# Patient Record
Sex: Male | Born: 1945 | Race: White | Hispanic: No | Marital: Married | Smoking: Former smoker
Health system: Southern US, Community
[De-identification: ages and names within clinical notes are randomized; demographics above are authoritative.]

## PROBLEM LIST (undated history)

## (undated) DIAGNOSIS — I639 Cerebral infarction, unspecified: Secondary | ICD-10-CM

## (undated) DIAGNOSIS — R7611 Nonspecific reaction to tuberculin skin test without active tuberculosis: Secondary | ICD-10-CM

## (undated) DIAGNOSIS — H9190 Unspecified hearing loss, unspecified ear: Secondary | ICD-10-CM

## (undated) DIAGNOSIS — K5790 Diverticulosis of intestine, part unspecified, without perforation or abscess without bleeding: Secondary | ICD-10-CM

## (undated) DIAGNOSIS — N200 Calculus of kidney: Secondary | ICD-10-CM

## (undated) DIAGNOSIS — Z936 Other artificial openings of urinary tract status: Secondary | ICD-10-CM

## (undated) DIAGNOSIS — K219 Gastro-esophageal reflux disease without esophagitis: Secondary | ICD-10-CM

## (undated) DIAGNOSIS — Z86718 Personal history of other venous thrombosis and embolism: Secondary | ICD-10-CM

## (undated) DIAGNOSIS — C801 Malignant (primary) neoplasm, unspecified: Secondary | ICD-10-CM

## (undated) DIAGNOSIS — I10 Essential (primary) hypertension: Secondary | ICD-10-CM

## (undated) DIAGNOSIS — Z8551 Personal history of malignant neoplasm of bladder: Secondary | ICD-10-CM

## (undated) DIAGNOSIS — R000 Tachycardia, unspecified: Secondary | ICD-10-CM

## (undated) HISTORY — DX: Cerebral infarction, unspecified: I63.9

## (undated) HISTORY — DX: Personal history of other venous thrombosis and embolism: Z86.718

## (undated) HISTORY — DX: Essential (primary) hypertension: I10

## (undated) HISTORY — DX: Calculus of kidney: N20.0

---

## 2009-10-24 ENCOUNTER — Ambulatory Visit: Payer: Self-pay | Admitting: Gastroenterology

## 2012-10-29 DIAGNOSIS — I6789 Other cerebrovascular disease: Secondary | ICD-10-CM

## 2012-10-29 HISTORY — DX: Other cerebrovascular disease: I67.89

## 2014-03-21 ENCOUNTER — Ambulatory Visit: Payer: Self-pay

## 2014-04-07 ENCOUNTER — Ambulatory Visit: Payer: Self-pay | Admitting: Physician Assistant

## 2014-06-28 ENCOUNTER — Ambulatory Visit: Payer: Self-pay | Admitting: Emergency Medicine

## 2014-09-15 ENCOUNTER — Ambulatory Visit: Payer: Self-pay | Admitting: Gastroenterology

## 2015-02-21 LAB — SURGICAL PATHOLOGY

## 2015-06-15 ENCOUNTER — Other Ambulatory Visit: Payer: Self-pay | Admitting: Family Medicine

## 2015-06-15 DIAGNOSIS — R51 Headache: Principal | ICD-10-CM

## 2015-06-15 DIAGNOSIS — R519 Headache, unspecified: Secondary | ICD-10-CM

## 2015-06-22 ENCOUNTER — Ambulatory Visit
Admission: RE | Admit: 2015-06-22 | Discharge: 2015-06-22 | Disposition: A | Discharge status: Home / Self Care | Payer: Medicare Other | Source: Ambulatory Visit | Attending: Family Medicine | Admitting: Family Medicine

## 2015-06-22 DIAGNOSIS — R519 Headache, unspecified: Secondary | ICD-10-CM

## 2015-06-22 DIAGNOSIS — G319 Degenerative disease of nervous system, unspecified: Secondary | ICD-10-CM | POA: Insufficient documentation

## 2015-06-22 DIAGNOSIS — R51 Headache: Secondary | ICD-10-CM | POA: Insufficient documentation

## 2015-08-29 DIAGNOSIS — I679 Cerebrovascular disease, unspecified: Secondary | ICD-10-CM | POA: Insufficient documentation

## 2015-08-29 DIAGNOSIS — I10 Essential (primary) hypertension: Secondary | ICD-10-CM | POA: Insufficient documentation

## 2015-10-30 HISTORY — PX: BLADDER REMOVAL: SHX567

## 2015-12-13 ENCOUNTER — Other Ambulatory Visit: Payer: Self-pay | Admitting: Family Medicine

## 2015-12-13 DIAGNOSIS — M5416 Radiculopathy, lumbar region: Secondary | ICD-10-CM

## 2015-12-30 ENCOUNTER — Ambulatory Visit
Admission: RE | Admit: 2015-12-30 | Discharge: 2015-12-30 | Disposition: A | Discharge status: Home / Self Care | Payer: Medicare Other | Source: Ambulatory Visit | Attending: Family Medicine | Admitting: Family Medicine

## 2015-12-30 DIAGNOSIS — G8929 Other chronic pain: Secondary | ICD-10-CM | POA: Insufficient documentation

## 2015-12-30 DIAGNOSIS — M545 Low back pain: Secondary | ICD-10-CM | POA: Diagnosis present

## 2015-12-30 DIAGNOSIS — M47816 Spondylosis without myelopathy or radiculopathy, lumbar region: Secondary | ICD-10-CM | POA: Diagnosis not present

## 2015-12-30 DIAGNOSIS — M5136 Other intervertebral disc degeneration, lumbar region: Secondary | ICD-10-CM | POA: Insufficient documentation

## 2015-12-30 DIAGNOSIS — N289 Disorder of kidney and ureter, unspecified: Secondary | ICD-10-CM | POA: Diagnosis not present

## 2015-12-30 DIAGNOSIS — M5416 Radiculopathy, lumbar region: Secondary | ICD-10-CM

## 2016-01-20 DIAGNOSIS — M48061 Spinal stenosis, lumbar region without neurogenic claudication: Secondary | ICD-10-CM | POA: Insufficient documentation

## 2016-02-23 ENCOUNTER — Encounter: Payer: Self-pay | Admitting: Urology

## 2016-02-23 ENCOUNTER — Ambulatory Visit (INDEPENDENT_AMBULATORY_CARE_PROVIDER_SITE_OTHER): Payer: Medicare Other | Admitting: Urology

## 2016-02-23 VITALS — BP 129/79 | HR 58 | Ht 66.0 in | Wt 212.5 lb

## 2016-02-23 DIAGNOSIS — N281 Cyst of kidney, acquired: Secondary | ICD-10-CM

## 2016-02-23 DIAGNOSIS — Q6102 Congenital multiple renal cysts: Secondary | ICD-10-CM

## 2016-02-23 DIAGNOSIS — R3129 Other microscopic hematuria: Secondary | ICD-10-CM

## 2016-02-23 NOTE — Progress Notes (Signed)
02/23/2016 4:19 PM   Tora Perches Jan 02, 1946 [id number]  Referring provider: Sherrin Daisy, MD [place], [place] [id number]  No chief complaint on file.   HPI: The patient is a 70 year old gentleman who presents for evaluation of bilateral renal cyst and microscopic hematuria.   1. Microscopic Hematuria This is an incidental finding as nephrologist office. He did have a urinary tract infection MRSA 1 month prior to this sample that showed microhematuria. He is a former smoker.  2. Bilateral renal cysts Cysts are seen on a recent MRI of the spine. The largest cyst was 5 cm on the right. From what was visualized on the scan that did appear to be benign. He does complain of midline lower back pain that radiates to his shoulder. He denies CVA tenderness.  3. History of nephrolithiasis The patient has history of passing a stone spontaneously 50 years ago. This was his only stone.   Cr: 0.9 on January 26, 2016  PMH: Past Medical History  Diagnosis Date  . H/O blood clots   . Hypertension   . Stroke ([place])   . Kidney stone     Surgical History: No past surgical history on file.  Home Medications:    Medication List       This list is accurate as of: 02/23/16  4:19 PM.  Always use your most recent med list.               aspirin EC 81 MG tablet  Take by mouth.     Cetirizine HCl 10 MG Caps  Take by mouth.     Cinnamon 500 MG capsule  Take by mouth.     lisinopril 10 MG tablet  Commonly known as:  PRINIVIL,ZESTRIL  Take by mouth.     Omega-3 1000 MG Caps  Take by mouth.     Ubiquinol 100 MG Caps  Take by mouth.        Allergies:  Allergies  Allergen Reactions  . Cefaclor     Family History: Family History  Problem Relation Age of Onset  . Kidney disease Mother   . Prostate cancer Neg Hx   . Kidney cancer Neg Hx     Social History:  reports that he has quit smoking. He does not have any smokeless tobacco history on file. He  reports that he does not drink alcohol or use illicit drugs.  ROS: UROLOGY Frequent Urination?: Yes Hard to postpone urination?: Yes Burning/pain with urination?: No Get up at night to urinate?: Yes Leakage of urine?: Yes Urine stream starts and stops?: No Trouble starting stream?: No Do you have to strain to urinate?: No Blood in urine?: Yes Urinary tract infection?: No Sexually transmitted disease?: No Injury to kidneys or bladder?: No Painful intercourse?: No Weak stream?: Yes Erection problems?: No Penile pain?: No  Gastrointestinal Nausea?: No Vomiting?: No Indigestion/heartburn?: Yes Diarrhea?: No Constipation?: No  Constitutional Fever: No Night sweats?: No Weight loss?: No Fatigue?: No  Skin Skin rash/lesions?: Yes Itching?: No  Eyes Blurred vision?: No Double vision?: No  Ears/Nose/Throat Sore throat?: No Sinus problems?: Yes  Hematologic/Lymphatic Swollen glands?: No Easy bruising?: Yes  Cardiovascular Leg swelling?: No Chest pain?: No  Respiratory Cough?: No Shortness of breath?: No  Endocrine Excessive thirst?: No  Musculoskeletal Back pain?: Yes Joint pain?: No  Neurological Headaches?: Yes Dizziness?: No  Psychologic Depression?: No Anxiety?: No  Physical Exam: BP 129/79 mmHg  Pulse 58  Ht 5\' 6"  (1.676 m)  Wt 212 lb 8 oz (96.389 kg)  BMI 34.31 kg/m2  Constitutional:  Alert and oriented, No acute distress. HEENT: [place] AT, moist mucus membranes.  Trachea midline, no masses. Cardiovascular: No clubbing, cyanosis, or edema. Respiratory: Normal respiratory effort, no increased work of breathing. GI: Abdomen is soft, nontender, nondistended, no abdominal masses GU: No CVA tenderness.  Skin: No rashes, bruises or suspicious lesions. Lymph: No cervical or inguinal adenopathy. Neurologic: Grossly intact, no focal deficits, moving all 4 extremities. Psychiatric: Normal mood and affect.  Laboratory Data: No results found for:  WBC, HGB, HCT, MCV, PLT  No results found for: CREATININE  No results found for: PSA  No results found for: TESTOSTERONE  No results found for: HGBA1C  Urinalysis No results found for: COLORURINE, APPEARANCEUR, LABSPEC, PHURINE, GLUCOSEU, HGBUR, BILIRUBINUR, KETONESUR, PROTEINUR, UROBILINOGEN, NITRITE, LEUKOCYTESUR   Assessment & Plan:    1. Microscopic hematuria -CT urogram -follow up for cystoscopy after above  2. Bilateral renal cysts -will be able to better visualize on above imaging -Cysts unlikely source of the patient's back pain  3. History of nephrolithiasis -Imaging for above will delineate any stone burden if present  Return for cystoscopy after CT urogram.  Nickie Retort, [place] [address], [place],  [address] [phone]

## 2016-02-24 LAB — URINALYSIS, COMPLETE
BILIRUBIN UA: NEGATIVE
GLUCOSE, UA: NEGATIVE
Ketones, UA: NEGATIVE
Leukocytes, UA: NEGATIVE
Nitrite, UA: NEGATIVE
Specific Gravity, UA: 1.02 (ref 1.005–1.030)
UUROB: 0.2 mg/dL (ref 0.2–1.0)
pH, UA: 6.5 (ref 5.0–7.5)

## 2016-02-24 LAB — MICROSCOPIC EXAMINATION

## 2016-03-01 ENCOUNTER — Ambulatory Visit
Admission: RE | Admit: 2016-03-01 | Discharge: 2016-03-01 | Disposition: A | Discharge status: Home / Self Care | Payer: Medicare Other | Source: Ambulatory Visit | Attending: Urology | Admitting: Urology

## 2016-03-01 DIAGNOSIS — N281 Cyst of kidney, acquired: Secondary | ICD-10-CM

## 2016-03-01 DIAGNOSIS — I2510 Atherosclerotic heart disease of native coronary artery without angina pectoris: Secondary | ICD-10-CM | POA: Insufficient documentation

## 2016-03-01 DIAGNOSIS — R3129 Other microscopic hematuria: Secondary | ICD-10-CM | POA: Diagnosis not present

## 2016-03-01 DIAGNOSIS — N400 Enlarged prostate without lower urinary tract symptoms: Secondary | ICD-10-CM | POA: Diagnosis not present

## 2016-03-01 DIAGNOSIS — Q6102 Congenital multiple renal cysts: Secondary | ICD-10-CM | POA: Diagnosis present

## 2016-03-01 MED ORDER — IOPAMIDOL (ISOVUE-300) INJECTION 61%
150.0000 mL | Freq: Once | INTRAVENOUS | Status: AC | PRN
  Administered 2016-03-01: 125 mL via INTRAVENOUS

## 2016-03-15 ENCOUNTER — Ambulatory Visit: Payer: Medicare Other

## 2016-03-16 ENCOUNTER — Ambulatory Visit (INDEPENDENT_AMBULATORY_CARE_PROVIDER_SITE_OTHER): Payer: Medicare Other | Admitting: Urology

## 2016-03-16 ENCOUNTER — Encounter: Payer: Self-pay | Admitting: Urology

## 2016-03-16 VITALS — BP 144/84 | HR 49 | Ht 68.0 in | Wt 214.3 lb

## 2016-03-16 DIAGNOSIS — R3129 Other microscopic hematuria: Secondary | ICD-10-CM

## 2016-03-16 DIAGNOSIS — D494 Neoplasm of unspecified behavior of bladder: Secondary | ICD-10-CM

## 2016-03-16 LAB — URINALYSIS, COMPLETE
Bilirubin, UA: NEGATIVE
GLUCOSE, UA: NEGATIVE
KETONES UA: NEGATIVE
Nitrite, UA: NEGATIVE
SPEC GRAV UA: 1.025 (ref 1.005–1.030)
Urobilinogen, Ur: 0.2 mg/dL (ref 0.2–1.0)
pH, UA: 6.5 (ref 5.0–7.5)

## 2016-03-16 LAB — MICROSCOPIC EXAMINATION
RBC, UA: >30 /hpf — AB (ref 0–?)
WBC, UA: >30 /hpf — AB (ref 0–?)

## 2016-03-16 NOTE — Progress Notes (Signed)
03/16/2016 11:35 AM   Daniel Bush [birth year] [id number]  Referring provider: Sherrin Daisy, MD [place], [place] [id number]  Chief Complaint  Patient presents with  . Follow-up    microscopic hematruia, CT Results    HPI: The patient is a 70 year old gentleman who presents for evaluation of bilateral renal cyst and microscopic hematuria.   1. Microscopic Hematuria This is an incidental finding as nephrologist office. He did have a urinary tract infection MRSA 1 month prior to this sample that showed microhematuria. He is a former smoker.  CT scan showed plaque-like lesions on the left wall of the bladder concerning for TCC.  2. Bilateral renal cysts Cysts are seen on a recent MRI of the spine. The largest cyst was 5 cm on the right. From what was visualized on the scan that did appear to be benign. He does complain of midline lower back pain that radiates to his shoulder. He denies CVA tenderness.  3. History of nephrolithiasis The patient has history of passing a stone spontaneously 50 years ago. This was his only stone.   Cr: 0.9 on January 26, 2016    PMH: Past Medical History  Diagnosis Date  . H/O blood clots   . Hypertension   . Stroke ([place])   . Kidney stone     Surgical History: No past surgical history on file.  Home Medications:    Medication List       This list is accurate as of: 03/16/16 11:35 AM.  Always use your most recent med list.               ALEVE 220 MG tablet  Generic drug:  naproxen sodium  Take 220 mg by mouth.     aspirin EC 81 MG tablet  Take by mouth.     B COMPLEX-VITAMIN B12 PO  Take by mouth.     Cetirizine HCl 10 MG Caps  Take by mouth.     Cinnamon 500 MG capsule  Take by mouth.     lisinopril 10 MG tablet  Commonly known as:  PRINIVIL,ZESTRIL  Take by mouth.     niacin 100 MG tablet  Take 100 mg by mouth at bedtime.     Omega-3 1000 MG Caps  Take by mouth.     Ubiquinol 100 MG Caps    Take by mouth.        Allergies:  Allergies  Allergen Reactions  . Cefaclor     Family History: Family History  Problem Relation Age of Onset  . Kidney disease Mother   . Prostate cancer Neg Hx   . Kidney cancer Neg Hx     Social History:  reports that he has quit smoking. He does not have any smokeless tobacco history on file. He reports that he does not drink alcohol or use illicit drugs.  ROS: UROLOGY Frequent Urination?: No Hard to postpone urination?: Yes Burning/pain with urination?: No Get up at night to urinate?: Yes Leakage of urine?: Yes Urine stream starts and stops?: No Trouble starting stream?: No Do you have to strain to urinate?: No Blood in urine?: No Urinary tract infection?: No Sexually transmitted disease?: No Injury to kidneys or bladder?: No Painful intercourse?: No Weak stream?: No Erection problems?: No Penile pain?: No  Gastrointestinal Nausea?: No Vomiting?: No Indigestion/heartburn?: No Diarrhea?: No Constipation?: No  Constitutional Fever: No Night sweats?: No Weight loss?: No Fatigue?: No  Skin Skin rash/lesions?: No Itching?: No  Eyes  Blurred vision?: No Double vision?: No  Ears/Nose/Throat Sore throat?: No Sinus problems?: No  Hematologic/Lymphatic Swollen glands?: No Easy bruising?: No  Cardiovascular Leg swelling?: No Chest pain?: No  Respiratory Cough?: No Shortness of breath?: No  Endocrine Excessive thirst?: No  Musculoskeletal Back pain?: No Joint pain?: No  Neurological Headaches?: No Dizziness?: No  Psychologic Depression?: No Anxiety?: No  Physical Exam: BP 144/84 mmHg  Pulse 49  Ht 5\' 8"  (1.727 m)  Wt 214 lb 4.8 oz (97.206 kg)  BMI 32.59 kg/m2  Constitutional:  Alert and oriented, No acute distress. HEENT: [place] AT, moist mucus membranes.  Trachea midline, no masses. Cardiovascular: No clubbing, cyanosis, or edema. Respiratory: Normal respiratory effort, no increased work of  breathing. GI: Abdomen is soft, nontender, nondistended, no abdominal masses GU: No CVA tenderness.  Skin: No rashes, bruises or suspicious lesions. Lymph: No cervical or inguinal adenopathy. Neurologic: Grossly intact, no focal deficits, moving all 4 extremities. Psychiatric: Normal mood and affect.  Laboratory Data: No results found for: WBC, HGB, HCT, MCV, PLT  No results found for: CREATININE  No results found for: PSA  No results found for: TESTOSTERONE  No results found for: HGBA1C  Urinalysis    Component Value Date/Time   APPEARANCEUR Cloudy* 02/23/2016 1602   GLUCOSEU Negative 02/23/2016 1602   BILIRUBINUR Negative 02/23/2016 1602   PROTEINUR 1+* 02/23/2016 1602   NITRITE Negative 02/23/2016 1602   LEUKOCYTESUR Negative 02/23/2016 1602    Pertinent Imaging: CLINICAL DATA: Microscopic hematuria. Difficulty emptying bladder for several months. Followup of renal cyst.  EXAM: CT ABDOMEN AND PELVIS WITHOUT AND WITH CONTRAST  TECHNIQUE: Multidetector CT imaging of the abdomen and pelvis was performed following the standard protocol before and following the bolus administration of intravenous contrast.  CONTRAST: 110mL ISOVUE-300 IOPAMIDOL (ISOVUE-300) INJECTION 61%  COMPARISON: 12/30/2015 lumbar spine MRI.  FINDINGS: Lower chest: Clear lung bases. Right coronary artery atherosclerosis. Mild cardiomegaly. Tiny hiatal hernia.  Hepatobiliary: Scattered too small to characterize liver lesions. A left hepatic lobe 2.3 cm lesion is most consistent with a cyst or minimally complex cyst. Normal gallbladder, without biliary ductal dilatation.  Pancreas: Normal, without mass or ductal dilatation.  Spleen: Normal in size, without focal abnormality.  Adrenals/Urinary Tract: Normal adrenal glands. No renal calculi or hydronephrosis. No hydroureter or ureteric calculi. No bladder calculi. Right larger than left bilateral renal cysts. Other right renal  lesions which are too small to characterize. Small volume bilateral renal sinus cysts. Moderate renal collecting system opacification on delayed images. Good right and moderate left ureteric opacification, without filling defect.  Central soft tissue density within the posterior urinary bladder is favored to represent median lobe of the prostate on image 81/series 4. There is separate area of eccentric left bladder wall thickening and enhancement, including on image 77/series 4. Concurrent filling defects including on image 83/series 7.  Separate area of inferior bladder wall thickening and enhancement on sagittal image 80/ series 606.  Mild pericystic edema.  Stomach/Bowel: Normal remainder of the stomach. Scattered colonic diverticula. Normal terminal ileum and appendix. Normal small bowel.  Vascular/Lymphatic: Aortic and branch vessel atherosclerosis. No abdominopelvic adenopathy.  Reproductive: Prostatomegaly.  Other: No significant free fluid. Small bilateral fat containing inguinal hernias.  Musculoskeletal: Degenerative partial fusion of the bilateral sacroiliac joints.  IMPRESSION: 1. findings suspicious for plaque-like bladder carcinoma or carcinomas. Left-sided focal bladder wall thickening and hyperenhancement. Separate area of inferior bladder wall thickening and enhancement is suspicious for multifocal disease. No evidence of abdominal pelvic metastatic  disease. 2. Prostatomegaly with presumed impression of the median lobe into the urinary bladder. 3. Mild pericystic edema which given the clinical history could represent concurrent cystitis. 4. Atherosclerosis, including within the coronary arteries. These results will be called to the ordering clinician or representative by the Radiologist Assistant, and communication documented in the PACS or zVision Dashboard.   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 I discussed this with him in great detail. He understands the next that would be a transurethral resection of bladder tumor. We discussed the risks, benefits, indications procedure. The patient is interested in obtaining a second opinion from [facility] urology. We will arrange for the patient to undergo this consult. If he elects to have the surgery with [place], he will call us to schedule a TURBT.  1. Bladder lesion -Patient need's TURBT but would like a second opinion from [facility]. Will arrange for consult.   2. Microscopic hematuria -completion via cytoscopy during above  3. Bilateral renal cysts -benign  4. History of nephrolithiasis -No evidence of disease   Nickie Retort, [facility] [address], [place], [place] [address] [phone]

## 2016-03-19 ENCOUNTER — Telehealth: Payer: Self-pay | Admitting: Urology

## 2016-03-19 LAB — CULTURE, URINE COMPREHENSIVE

## 2016-03-19 NOTE — Telephone Encounter (Signed)
Faxed referral to [facility] for second opinion @ [phone] Phone# [phone]  They will review the notes and call the patient with the appointment   Sharyn Lull

## 2016-03-29 ENCOUNTER — Telehealth: Payer: Self-pay | Admitting: Urology

## 2016-03-29 ENCOUNTER — Other Ambulatory Visit: Payer: Medicare Other

## 2016-03-29 NOTE — Telephone Encounter (Signed)
Spoke with Daniel Bush at the [facility] [place] center today [phone] opt 1 She never called me back from 03-16-16 but I made his appt for 05-09-16 @ 9:00 with Dr. Thomos Lemons at the [facility]. I faxed his records to [phone]  I called and spoke with the patient and let him know and I have offered him a free parking pass to the [facility]. He will come by the office and get this. He will also call them daily/weekly to see if they can get him in earlier.

## 2016-04-23 DIAGNOSIS — N329 Bladder disorder, unspecified: Secondary | ICD-10-CM | POA: Insufficient documentation

## 2016-05-09 DIAGNOSIS — Z87438 Personal history of other diseases of male genital organs: Secondary | ICD-10-CM | POA: Insufficient documentation

## 2016-06-01 DIAGNOSIS — C678 Malignant neoplasm of overlapping sites of bladder: Secondary | ICD-10-CM | POA: Insufficient documentation

## 2016-07-26 HISTORY — PX: PROSTATECTOMY: SHX69

## 2016-10-29 DIAGNOSIS — B029 Zoster without complications: Secondary | ICD-10-CM

## 2016-10-29 HISTORY — DX: Zoster without complications: B02.9

## 2016-10-29 HISTORY — PX: BACK SURGERY: SHX140

## 2017-01-24 DIAGNOSIS — G8929 Other chronic pain: Secondary | ICD-10-CM | POA: Insufficient documentation

## 2017-01-24 DIAGNOSIS — M546 Pain in thoracic spine: Secondary | ICD-10-CM | POA: Insufficient documentation

## 2017-03-17 DIAGNOSIS — R000 Tachycardia, unspecified: Secondary | ICD-10-CM | POA: Insufficient documentation

## 2017-04-05 DIAGNOSIS — I872 Venous insufficiency (chronic) (peripheral): Secondary | ICD-10-CM | POA: Insufficient documentation

## 2017-08-25 IMAGING — CT CT ABD-PEL WO/W CM
2 of 10 series · 10 of 46 positions shown, 16 images · IV contrast (iopamidol)
Comparison: 12/30/2015 lumbar spine MRI.

CLINICAL DATA: Microscopic hematuria. Difficulty emptying bladder
for several months. Followup of renal cyst.

EXAM:
CT ABDOMEN AND PELVIS WITHOUT AND WITH CONTRAST
TECHNIQUE: Multidetector CT imaging of the abdomen and pelvis was performed
following the standard protocol before and following the bolus
administration of intravenous contrast.
CONTRAST:  125mL BY2UZ6-144 IOPAMIDOL (BY2UZ6-144) INJECTION 61%

[Series 7: hematuria > 45 10 min delay · axial · delayed · 0.83mm/px · z∈[-942,-542]mm · 8 of 104 slices shown, 13 images]
[im 12/104  soft-tissue]
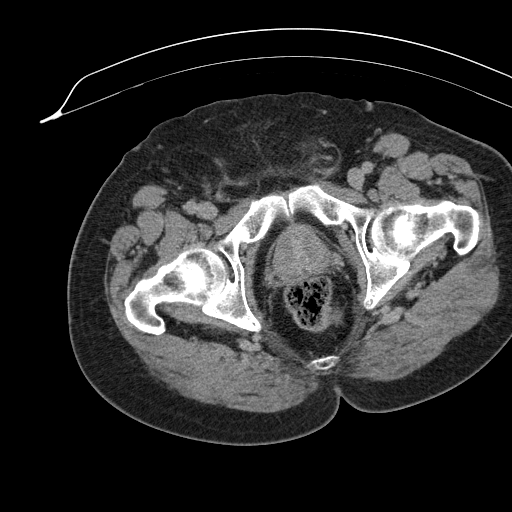
[im 12/104  bone]
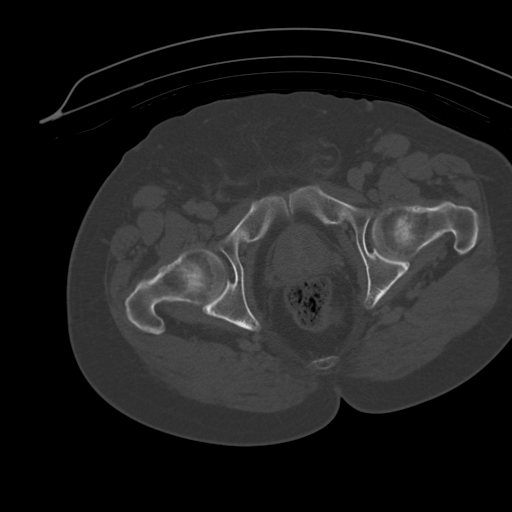
[im 23/104  soft-tissue]
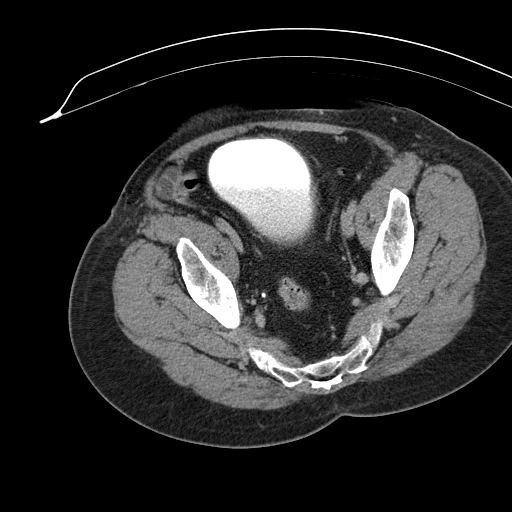
[im 35/104  soft-tissue]
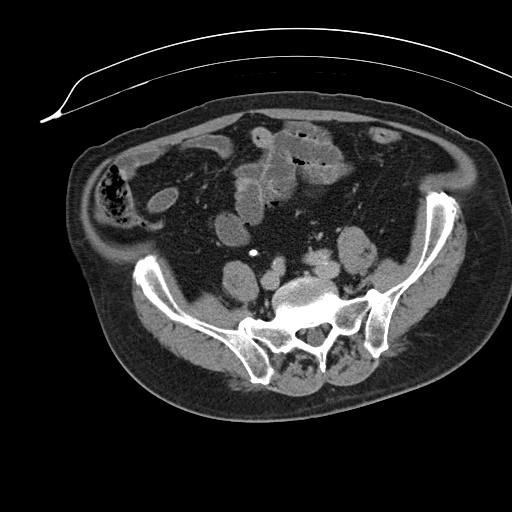
[im 46/104  soft-tissue]
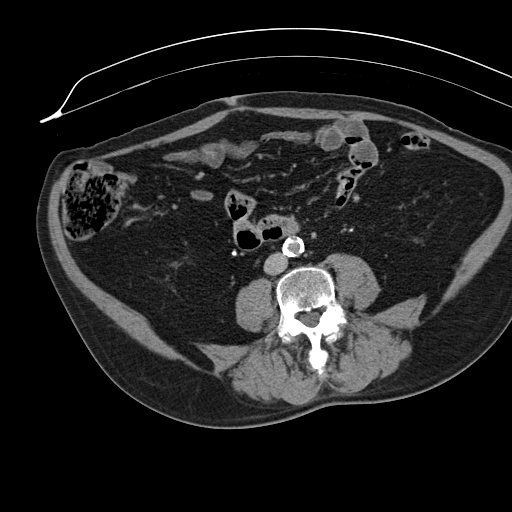
[im 58/104  soft-tissue]
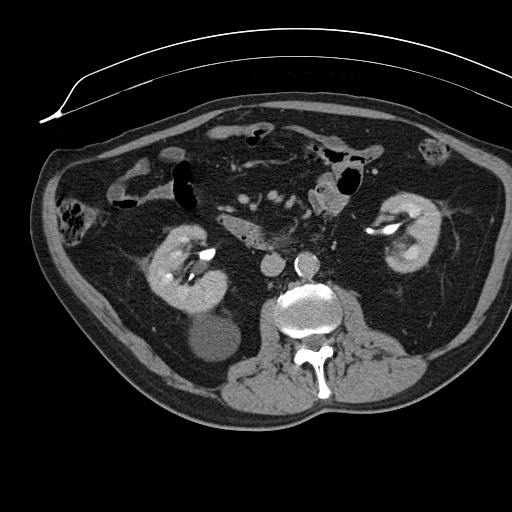
[im 58/104  lung]
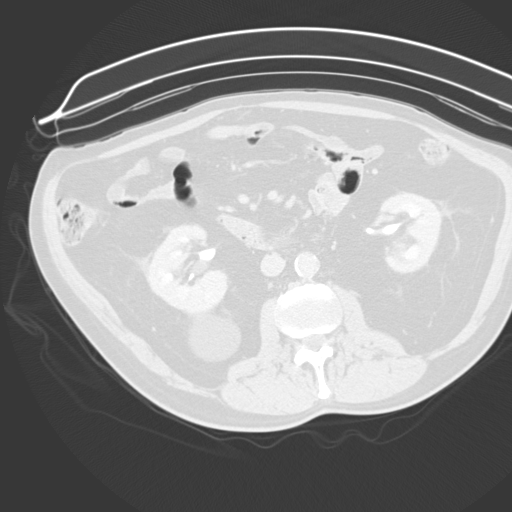
[im 69/104  soft-tissue]
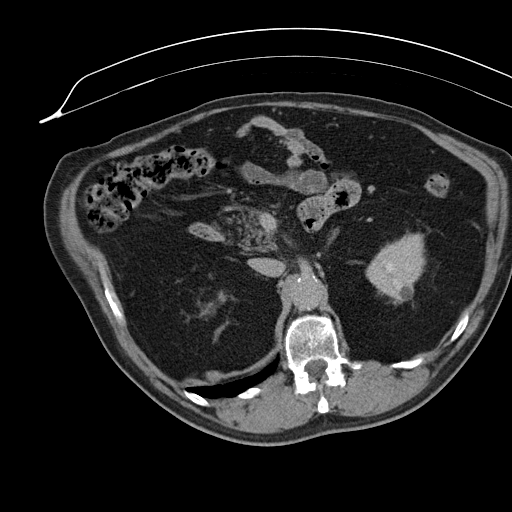
[im 69/104  lung]
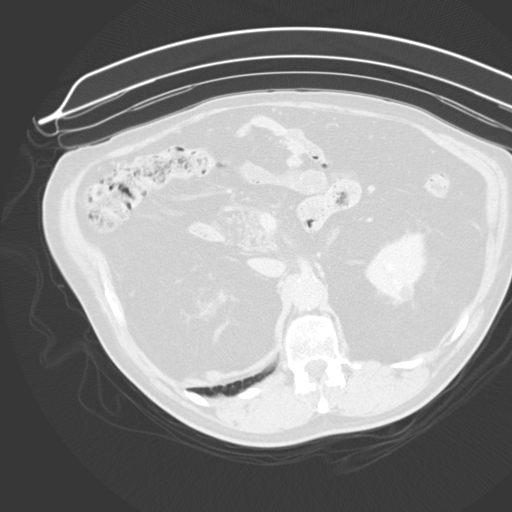
[im 81/104  soft-tissue]
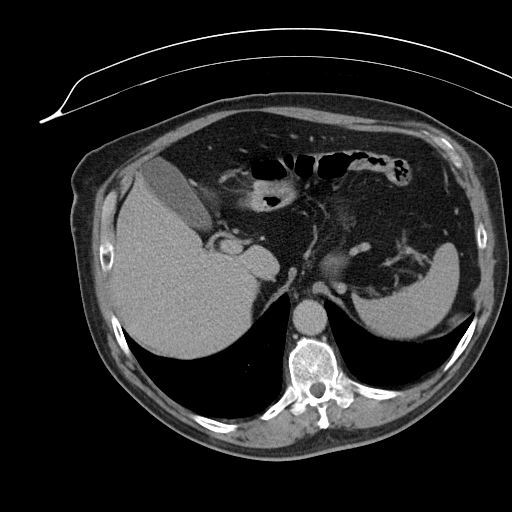
[im 81/104  lung]
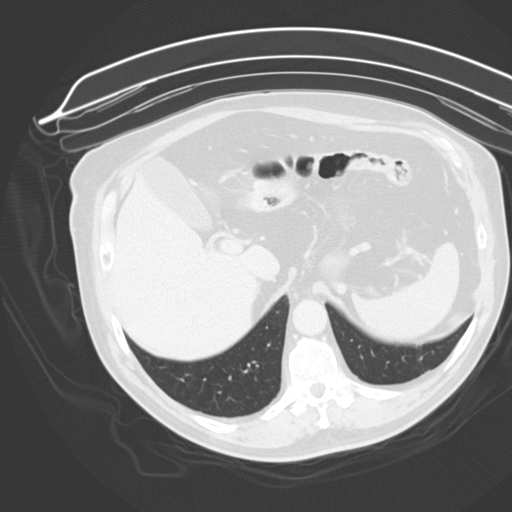
[im 92/104  soft-tissue]
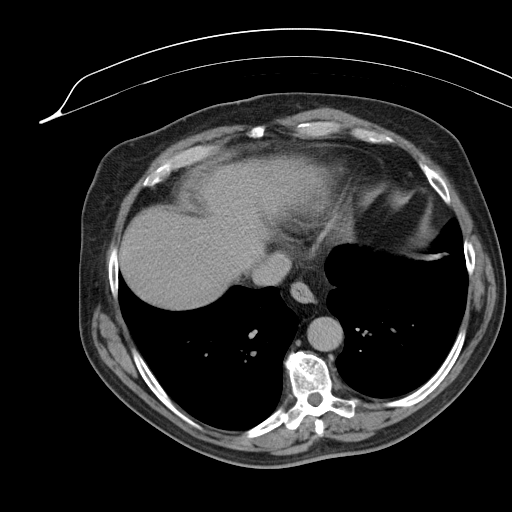
[im 92/104  lung]
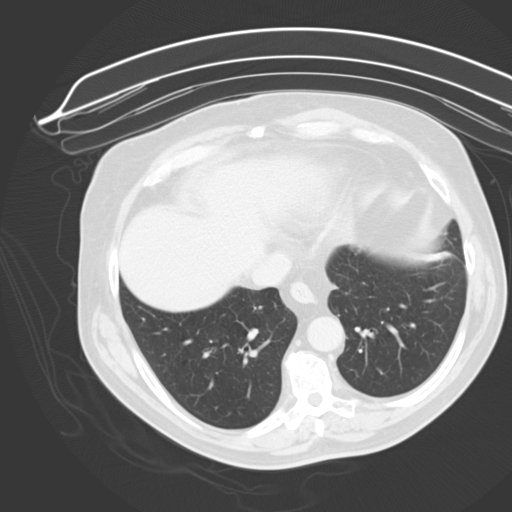

[Series 603: coronal wo · coronal · 0.97mm/px · 2 of 142 slices shown, 3 images]
[im 48/142  soft-tissue]
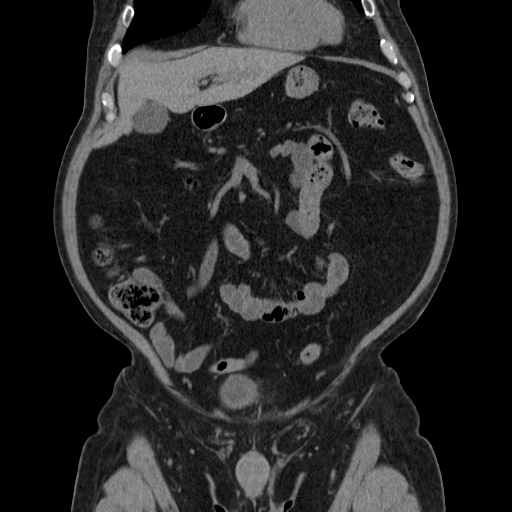
[im 48/142  bone]
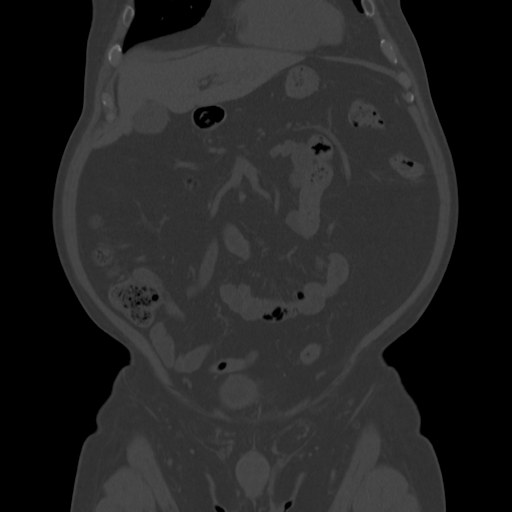
[im 95/142  soft-tissue]
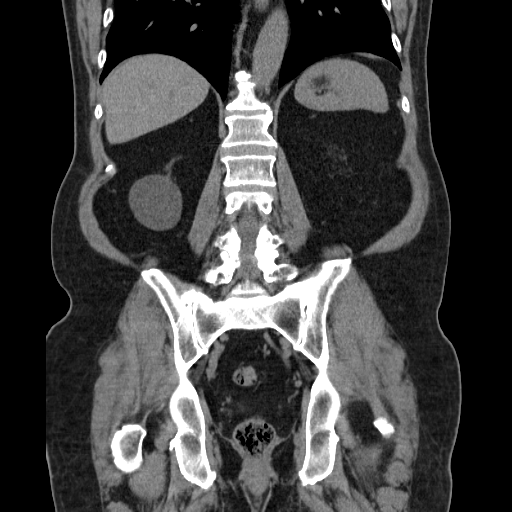

[10 of 46 positions shown; findings below may reference images not displayed]

FINDINGS: Lower chest: Clear lung bases. Right coronary artery
atherosclerosis. Mild cardiomegaly. Tiny hiatal hernia.

Hepatobiliary: Scattered too small to characterize liver lesions. A
left hepatic lobe 2.3 cm lesion is most consistent with a cyst or
minimally complex cyst. Normal gallbladder, without biliary ductal
dilatation.

Pancreas: Normal, without mass or ductal dilatation.

Spleen: Normal in size, without focal abnormality.

Adrenals/Urinary Tract: Normal adrenal glands. No renal calculi or
hydronephrosis. No hydroureter or ureteric calculi. No bladder
calculi. Right larger than left bilateral renal cysts. Other right
renal lesions which are too small to characterize. Small volume
bilateral renal sinus cysts. Moderate renal collecting system
opacification on delayed images. Good right and moderate left
ureteric opacification, without filling defect.

Central soft tissue density within the posterior urinary bladder is
favored to represent median lobe of the prostate on image 81/series
4. There is separate area of eccentric left bladder wall thickening
and enhancement, including on image 77/series 4. Concurrent filling
defects including on image 83/series 7.

Separate area of inferior bladder wall thickening and enhancement on
sagittal image 80/ series 606.

Mild pericystic edema.

Stomach/Bowel: Normal remainder of the stomach. Scattered colonic
diverticula. Normal terminal ileum and appendix. Normal small bowel.

Vascular/Lymphatic: Aortic and branch vessel atherosclerosis. No
abdominopelvic adenopathy.

Reproductive: Prostatomegaly.

Other: No significant free fluid. Small bilateral fat containing
inguinal hernias.

Musculoskeletal: Degenerative partial fusion of the bilateral
sacroiliac joints.
IMPRESSION: 1. findings suspicious for plaque-like bladder carcinoma or
carcinomas. Left-sided focal bladder wall thickening and
hyperenhancement. Separate area of inferior bladder wall thickening
and enhancement is suspicious for multifocal disease. No evidence of
abdominal pelvic metastatic disease.
2. Prostatomegaly with presumed impression of the median lobe into
the urinary bladder.
3. Mild pericystic edema which given the clinical history could
represent concurrent cystitis.
4.  Atherosclerosis, including within the coronary arteries.
These results will be called to the ordering clinician or
representative by the Radiologist Assistant, and communication
documented in the PACS or zVision Dashboard.

## 2018-09-10 ENCOUNTER — Other Ambulatory Visit: Payer: Self-pay | Admitting: Nurse Practitioner

## 2018-09-10 DIAGNOSIS — M545 Low back pain, unspecified: Secondary | ICD-10-CM

## 2018-09-10 DIAGNOSIS — M48061 Spinal stenosis, lumbar region without neurogenic claudication: Secondary | ICD-10-CM

## 2018-09-19 DIAGNOSIS — M4316 Spondylolisthesis, lumbar region: Secondary | ICD-10-CM | POA: Insufficient documentation

## 2018-09-30 ENCOUNTER — Ambulatory Visit: Payer: Medicare Other

## 2018-10-07 DIAGNOSIS — E538 Deficiency of other specified B group vitamins: Secondary | ICD-10-CM | POA: Insufficient documentation

## 2018-11-27 DIAGNOSIS — Z9889 Other specified postprocedural states: Secondary | ICD-10-CM | POA: Insufficient documentation

## 2019-06-13 ENCOUNTER — Ambulatory Visit
Admission: EM | Admit: 2019-06-13 | Discharge: 2019-06-13 | Disposition: A | Discharge status: Home / Self Care | Payer: Medicare Other | Attending: Family Medicine | Admitting: Family Medicine

## 2019-06-13 DIAGNOSIS — N3001 Acute cystitis with hematuria: Secondary | ICD-10-CM

## 2019-06-13 LAB — URINALYSIS, COMPLETE (UACMP) WITH MICROSCOPIC
Glucose, UA: NEGATIVE mg/dL
Nitrite: NEGATIVE
Protein, ur: 100 mg/dL — AB
Specific Gravity, Urine: 1.015 (ref 1.005–1.030)
pH: 6 (ref 5.0–8.0)

## 2019-06-13 MED ORDER — CIPROFLOXACIN HCL 500 MG PO TABS: 500.0000 mg | ORAL_TABLET | Freq: Two times a day (BID) | ORAL | 0 refills | Status: DC

## 2019-06-13 NOTE — Discharge Instructions (Signed)
Continue increased water intake

## 2019-06-13 NOTE — ED Provider Notes (Signed)
[facility]    CSN: [id number] Arrival date & time: 06/13/19  1006     History   Chief Complaint Chief Complaint  Patient presents with  . Urinary Tract Infection    HPI Daniel Bush is a 73 y.o. male.   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 Denies any abdominal pain, vomiting or chills.    Urinary Tract Infection   Past Medical History:  Diagnosis Date  . H/O blood clots   . Hypertension   . Kidney stone   . Stroke [facility])     Patient Active Problem List   Diagnosis Date Noted  . Lumbar canal stenosis 01/20/2016  . Lumbar radiculopathy 12/13/2015  . 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 Essential (primary) hypertension 08/29/2015    Past Surgical History:  Procedure Laterality Date  . BACK SURGERY    . BLADDER REMOVAL         Home Medications    Prior to Admission medications   Medication Sig Start Date End Date Taking? Authorizing Provider  carvedilol (COREG) 3.125 MG tablet  06/01/19  Yes [provider]  aspirin EC 81 MG tablet Take by mouth.    [provider]  B Complex-Folic Acid (B COMPLEX-VITAMIN B12 PO) Take by mouth.    [provider]  Cetirizine HCl 10 MG CAPS Take by mouth.    [provider]  Cinnamon 500 MG capsule Take by mouth.    [provider]  ciprofloxacin (CIPRO) 500 MG tablet Take 1 tablet (500 mg total) by mouth every 12 (twelve) hours. 06/13/19   Norval Gable, MD  lisinopril (PRINIVIL,ZESTRIL) 10 MG tablet Take by mouth. 08/11/15 08/10/16  [provider]  naproxen sodium (ALEVE) 220 MG tablet Take 220 mg by mouth.    [provider]  niacin 100 MG tablet Take 100 mg by mouth at bedtime.    [provider]  Omega-3 1000 MG CAPS Take by mouth.    [provider]   Ubiquinol 100 MG CAPS Take by mouth.    [provider]    Family History Family History  Problem Relation Age of Onset  . Kidney disease Mother   . Prostate cancer Neg Hx   . Kidney cancer Neg Hx     Social History Social History   Tobacco Use  . Smoking status: Former Research scientist (life sciences)  . Smokeless tobacco: Never Used  Substance Use Topics  . Alcohol use: No    Alcohol/week: 0.0 standard drinks  . Drug use: No     Allergies   Bee venom, Chlorhexidine, Diltiazem, Lisinopril, Cefaclor, Losartan, and Oxycodone-acetaminophen   Review of Systems Review of Systems   Physical Exam Triage Vital Signs ED Triage Vitals  Enc Vitals Group     BP 06/13/19 1019 (!) 155/101     Pulse Rate 06/13/19 1019 (!) 57     Resp 06/13/19 1019 18     Temp 06/13/19 1019 98.2 F (36.8 C)     Temp src --      SpO2 06/13/19 1019 100 %     Weight 06/13/19 1019 200 lb (90.7 kg)     Height 06/13/19 1019 5' 7.5" (1.715 m)     Head Circumference --      Peak Flow --  Pain Score 06/13/19 1018 0     Pain Loc --      Pain Edu? --      Excl. in [place]? --    No data found.  Updated Vital Signs BP (!) 155/101 (BP Location: Left Arm)   Pulse (!) 57   Temp 98.2 F (36.8 C)   Resp 18   Ht 5' 7.5" (1.715 m)   Wt 90.7 kg   SpO2 100%   BMI 30.86 kg/m   Visual Acuity Right Eye Distance:   Left Eye Distance:   Bilateral Distance:    Right Eye Near:   Left Eye Near:    Bilateral Near:     Physical Exam Vitals signs and nursing note reviewed.  Constitutional:      General: He is not in acute distress.    Appearance: He is not toxic-appearing or diaphoretic.  Abdominal:     General: There is no distension.     Palpations: Abdomen is soft.  Neurological:     Mental Status: He is alert.      UC Treatments / Results  Labs (all labs ordered are listed, but only abnormal results are displayed) Labs Reviewed  URINALYSIS, COMPLETE (UACMP) WITH MICROSCOPIC - Abnormal; Notable for  the following components:      Result Value   APPearance HAZY (*)    Hgb urine dipstick MODERATE (*)    Bilirubin Urine SMALL (*)    Ketones, ur TRACE (*)    Protein, ur 100 (*)    Leukocytes,Ua SMALL (*)    Bacteria, UA FEW (*)    All other components within normal limits  NOVEL CORONAVIRUS, NAA (HOSPITAL ORDER, SEND-OUT TO REF LAB)    EKG   Radiology No results found.  Procedures Procedures (including critical care time)  Medications Ordered in UC Medications - No data to display  Initial Impression / Assessment and Plan / UC Course  I have reviewed the triage vital signs and the nursing notes.  Pertinent labs & imaging results that were available during my care of the patient were reviewed by me and considered in my medical decision making (see chart for details).      Final Clinical Impressions(s) / UC Diagnoses   Final diagnoses:  Acute cystitis with hematuria     Discharge Instructions     Continue increased water intake    ED Prescriptions    Medication Sig Dispense Auth. Provider   ciprofloxacin (CIPRO) 500 MG tablet Take 1 tablet (500 mg total) by mouth every 12 (twelve) hours. 20 tablet Norval Gable, MD     1. Lab results and diagnosis reviewed with patient 2. rx as per orders above; reviewed possible side effects, interactions, risks and benefits  3. Recommend supportive treatment as above 4. Follow-up prn if symptoms worsen or don't improve  Controlled Substance Prescriptions [place] Controlled Substance Registry consulted? Not Applicable   Norval Gable, MD 06/13/19 1400

## 2019-06-13 NOTE — ED Triage Notes (Signed)
Started last night with low grade fever of 99.4 and having flank pain. Has a urostomy bag instead of a bladder due to bladder cancer.

## 2019-06-15 LAB — URINE CULTURE: Culture: >=100000 — AB

## 2019-06-15 LAB — NOVEL CORONAVIRUS, NAA (HOSP ORDER, SEND-OUT TO REF LAB; TAT 18-24 HRS): SARS-CoV-2, NAA: NOT DETECTED

## 2019-06-17 ENCOUNTER — Encounter (HOSPITAL_COMMUNITY): Payer: Self-pay

## 2019-08-13 DIAGNOSIS — M25551 Pain in right hip: Secondary | ICD-10-CM | POA: Insufficient documentation

## 2019-12-25 DIAGNOSIS — Z8551 Personal history of malignant neoplasm of bladder: Secondary | ICD-10-CM | POA: Insufficient documentation

## 2020-06-02 ENCOUNTER — Encounter: Payer: Self-pay | Admitting: Ophthalmology

## 2020-06-09 ENCOUNTER — Other Ambulatory Visit: Payer: Self-pay

## 2020-06-09 ENCOUNTER — Other Ambulatory Visit
Admission: RE | Admit: 2020-06-09 | Discharge: 2020-06-09 | Disposition: A | Discharge status: Home / Self Care | Payer: Medicare PPO | Source: Ambulatory Visit | Attending: Ophthalmology | Admitting: Ophthalmology

## 2020-06-09 DIAGNOSIS — Z20822 Contact with and (suspected) exposure to covid-19: Secondary | ICD-10-CM | POA: Insufficient documentation

## 2020-06-09 DIAGNOSIS — Z01812 Encounter for preprocedural laboratory examination: Secondary | ICD-10-CM | POA: Diagnosis present

## 2020-06-09 NOTE — Discharge Instructions (Signed)

## 2020-06-10 LAB — SARS CORONAVIRUS 2 (TAT 6-24 HRS): SARS Coronavirus 2: NEGATIVE

## 2020-06-10 NOTE — Anesthesia Preprocedure Evaluation (Addendum)
Anesthesia Evaluation  Patient identified by MRN, date of birth, ID band Patient awake    Reviewed: Allergy & Precautions, NPO status , Patient's Chart, lab work & pertinent test results, reviewed documented beta blocker date and time   History of Anesthesia Complications Negative for: history of anesthetic complications  Airway Mallampati: III  TM Distance: >3 FB Neck ROM: Full    Dental   Pulmonary former smoker,    breath sounds clear to auscultation       Cardiovascular hypertension, (-) angina(-) DOE + dysrhythmias (Sinus tachycardia) Supra Ventricular Tachycardia  Rhythm:Regular Rate:Normal   TTE (2018): NORMAL LEFT VENTRICULAR SYSTOLIC FUNCTION WITH MODERATE LVH  VALVULAR REGURGITATION: TRIVIAL PR, TRIVIAL TR  NO VALVULAR STENOSIS    Neuro/Psych  Neuromuscular disease (Lumbar stenosis c/b radiculopathy) CVA    GI/Hepatic neg GERD  ,  Endo/Other    Renal/GU Renal disease (Stones)     Musculoskeletal   Abdominal   Peds  Hematology   Anesthesia Other Findings Bladder cancer  Reproductive/Obstetrics                            Anesthesia Physical Anesthesia Plan  ASA: II  Anesthesia Plan: MAC   Post-op Pain Management:    Induction: Intravenous  PONV Risk Score and Plan: 1 and TIVA, Midazolam and Treatment may vary due to age or medical condition  Airway Management Planned: Nasal Cannula  Additional Equipment:   Intra-op Plan:   Post-operative Plan:   Informed Consent: I have reviewed the patients History and Physical, chart, labs and discussed the procedure including the risks, benefits and alternatives for the proposed anesthesia with the patient or authorized representative who has indicated his/her understanding and acceptance.       Plan Discussed with: CRNA and Anesthesiologist  Anesthesia Plan Comments:         Anesthesia Quick Evaluation

## 2020-06-13 ENCOUNTER — Ambulatory Visit
Admission: RE | Admit: 2020-06-13 | Discharge: 2020-06-13 | Disposition: A | Discharge status: Home / Self Care | Payer: Medicare PPO | Attending: Ophthalmology | Admitting: Ophthalmology

## 2020-06-13 ENCOUNTER — Encounter
Admission: RE | Disposition: A | Discharge status: Home / Self Care | Payer: Self-pay | Source: Home / Self Care | Attending: Ophthalmology

## 2020-06-13 ENCOUNTER — Ambulatory Visit: Payer: Medicare PPO | Admitting: Anesthesiology

## 2020-06-13 ENCOUNTER — Encounter: Payer: Self-pay | Admitting: Ophthalmology

## 2020-06-13 ENCOUNTER — Other Ambulatory Visit: Payer: Self-pay

## 2020-06-13 DIAGNOSIS — Z85828 Personal history of other malignant neoplasm of skin: Secondary | ICD-10-CM | POA: Insufficient documentation

## 2020-06-13 DIAGNOSIS — Z8673 Personal history of transient ischemic attack (TIA), and cerebral infarction without residual deficits: Secondary | ICD-10-CM | POA: Diagnosis not present

## 2020-06-13 DIAGNOSIS — Z8551 Personal history of malignant neoplasm of bladder: Secondary | ICD-10-CM | POA: Diagnosis not present

## 2020-06-13 DIAGNOSIS — I10 Essential (primary) hypertension: Secondary | ICD-10-CM | POA: Diagnosis not present

## 2020-06-13 DIAGNOSIS — H2511 Age-related nuclear cataract, right eye: Secondary | ICD-10-CM | POA: Insufficient documentation

## 2020-06-13 DIAGNOSIS — Z87891 Personal history of nicotine dependence: Secondary | ICD-10-CM | POA: Insufficient documentation

## 2020-06-13 DIAGNOSIS — Z888 Allergy status to other drugs, medicaments and biological substances status: Secondary | ICD-10-CM | POA: Insufficient documentation

## 2020-06-13 HISTORY — DX: Nonspecific reaction to tuberculin skin test without active tuberculosis: R76.11

## 2020-06-13 HISTORY — PX: CATARACT EXTRACTION W/PHACO: SHX586

## 2020-06-13 HISTORY — DX: Personal history of malignant neoplasm of bladder: Z85.51

## 2020-06-13 HISTORY — DX: Tachycardia, unspecified: R00.0

## 2020-06-13 HISTORY — DX: Other artificial openings of urinary tract status: Z93.6

## 2020-06-13 SURGERY — PHACOEMULSIFICATION, CATARACT, WITH IOL INSERTION
Anesthesia: Monitor Anesthesia Care | Site: Eye | Laterality: Right

## 2020-06-13 MED ORDER — FENTANYL CITRATE (PF) 100 MCG/2ML IJ SOLN
INTRAMUSCULAR | Status: DC | PRN
  Administered 2020-06-13: 50 ug via INTRAVENOUS

## 2020-06-13 MED ORDER — LIDOCAINE HCL (PF) 2 % IJ SOLN
INTRAOCULAR | Status: DC | PRN
  Administered 2020-06-13: 1 mL via INTRAOCULAR

## 2020-06-13 MED ORDER — ACETAMINOPHEN 10 MG/ML IV SOLN: 1000.0000 mg | Freq: Once | INTRAVENOUS | Status: DC | PRN

## 2020-06-13 MED ORDER — MIDAZOLAM HCL 2 MG/2ML IJ SOLN
INTRAMUSCULAR | Status: DC | PRN
  Administered 2020-06-13: 1 mg via INTRAVENOUS

## 2020-06-13 MED ORDER — MOXIFLOXACIN HCL 0.5 % OP SOLN
OPHTHALMIC | Status: DC | PRN
  Administered 2020-06-13: 0.2 mL via OPHTHALMIC

## 2020-06-13 MED ORDER — SODIUM HYALURONATE 10 MG/ML IO SOLN
INTRAOCULAR | Status: DC | PRN
  Administered 2020-06-13: 0.55 mL via INTRAOCULAR

## 2020-06-13 MED ORDER — LACTATED RINGERS IV SOLN: INTRAVENOUS | Status: DC

## 2020-06-13 MED ORDER — EPINEPHRINE PF 1 MG/ML IJ SOLN
INTRAOCULAR | Status: DC | PRN
  Administered 2020-06-13: 85 mL via OPHTHALMIC

## 2020-06-13 MED ORDER — ONDANSETRON HCL 4 MG/2ML IJ SOLN: 4.0000 mg | Freq: Once | INTRAMUSCULAR | Status: DC | PRN

## 2020-06-13 MED ORDER — ARMC OPHTHALMIC DILATING DROPS
1.0000 "application " | OPHTHALMIC | Status: DC | PRN
  Administered 2020-06-13 (×3): 1 via OPHTHALMIC

## 2020-06-13 MED ORDER — SODIUM HYALURONATE 23 MG/ML IO SOLN
INTRAOCULAR | Status: DC | PRN
  Administered 2020-06-13: 0.6 mL via INTRAOCULAR

## 2020-06-13 MED ORDER — TETRACAINE HCL 0.5 % OP SOLN
1.0000 [drp] | OPHTHALMIC | Status: DC | PRN
  Administered 2020-06-13 (×3): 1 [drp] via OPHTHALMIC

## 2020-06-13 SURGICAL SUPPLY — 20 items
CANNULA ANT/CHMB 27G (MISCELLANEOUS) ×2 IMPLANT
CANNULA ANT/CHMB 27GA (MISCELLANEOUS) ×6 IMPLANT
DISSECTOR HYDRO NUCLEUS 50X22 (MISCELLANEOUS) ×3 IMPLANT
GLOVE SURG LX 7.5 STRW (GLOVE) ×4
GLOVE SURG LX STRL 7.5 STRW (GLOVE) ×1 IMPLANT
GLOVE SURG SYN 8.5  E (GLOVE) ×2
GLOVE SURG SYN 8.5 E (GLOVE) ×1 IMPLANT
GLOVE SURG SYN 8.5 PF PI (GLOVE) ×1 IMPLANT
GOWN STRL REUS W/ TWL LRG LVL3 (GOWN DISPOSABLE) ×2 IMPLANT
GOWN STRL REUS W/TWL LRG LVL3 (GOWN DISPOSABLE) ×6
LENS IOL DIOP 22.5 (Intraocular Lens) ×3 IMPLANT
LENS IOL TECNIS MONO 22.5 (Intraocular Lens) IMPLANT
MARKER SKIN DUAL TIP RULER LAB (MISCELLANEOUS) ×3 IMPLANT
PACK DR. KING ARMS (PACKS) ×3 IMPLANT
PACK EYE AFTER SURG (MISCELLANEOUS) ×3 IMPLANT
PACK OPTHALMIC (MISCELLANEOUS) ×3 IMPLANT
SYR 3ML LL SCALE MARK (SYRINGE) ×3 IMPLANT
SYR TB 1ML LUER SLIP (SYRINGE) ×3 IMPLANT
WATER STERILE IRR 250ML POUR (IV SOLUTION) ×3 IMPLANT
WIPE NON LINTING 3.25X3.25 (MISCELLANEOUS) ×3 IMPLANT

## 2020-06-13 NOTE — Transfer of Care (Signed)
Immediate Anesthesia Transfer of Care Note  Patient: Daniel Bush  Procedure(s) Performed: CATARACT EXTRACTION PHACO AND INTRAOCULAR LENS PLACEMENT (IOC) RIGHT (Right Eye)  Patient Location: PACU  Anesthesia Type: MAC  Level of Consciousness: awake, alert  and patient cooperative  Airway and Oxygen Therapy: Patient Spontanous Breathing and Patient connected to supplemental oxygen  Post-op Assessment: Post-op Vital signs reviewed, Patient's Cardiovascular Status Stable, Respiratory Function Stable, Patent Airway and No signs of Nausea or vomiting  Post-op Vital Signs: Reviewed and stable  Complications: No complications documented.

## 2020-06-13 NOTE — Anesthesia Postprocedure Evaluation (Signed)
Anesthesia Post Note  Patient: Daniel Bush  Procedure(s) Performed: CATARACT EXTRACTION PHACO AND INTRAOCULAR LENS PLACEMENT (IOC) RIGHT (Right Eye)     Patient location during evaluation: PACU Anesthesia Type: MAC Level of consciousness: awake and alert Pain management: pain level controlled Vital Signs Assessment: post-procedure vital signs reviewed and stable Respiratory status: spontaneous breathing, nonlabored ventilation, respiratory function stable and patient connected to nasal cannula oxygen Cardiovascular status: stable and blood pressure returned to baseline Postop Assessment: no apparent nausea or vomiting Anesthetic complications: no   No complications documented.  Omario Ander A  Radley Barto

## 2020-06-13 NOTE — H&P (Signed)

## 2020-06-13 NOTE — Anesthesia Procedure Notes (Signed)
Procedure Name: MAC Date/Time: 06/13/2020 8:57 AM Performed by: Silvana Newness, CRNA Pre-anesthesia Checklist: Patient identified, Emergency Drugs available, Suction available, Patient being monitored and Timeout performed Patient Re-evaluated:Patient Re-evaluated prior to induction Oxygen Delivery Method: Nasal cannula Placement Confirmation: positive ETCO2

## 2020-06-13 NOTE — Op Note (Signed)
OPERATIVE NOTE  Daniel Bush [id number] 06/13/2020   PREOPERATIVE DIAGNOSIS:  Nuclear sclerotic cataract right eye.  H25.11   POSTOPERATIVE DIAGNOSIS:    Nuclear sclerotic cataract right eye.     PROCEDURE:  Phacoemusification with posterior chamber intraocular lens placement of the right eye   LENS:   Implant Name Type Inv. Item Serial No. Manufacturer Lot No. LRB No. Used Action  LENS IOL DIOP 22.5 - [id number] Intraocular Lens LENS IOL DIOP 22.5 [id number] [employer]  Right 1 Implanted       Procedure(s) with comments: CATARACT EXTRACTION PHACO AND INTRAOCULAR LENS PLACEMENT (IOC) RIGHT (Right) - 5.04 0:40.2  DCB00 +22.5   ULTRASOUND TIME: 0 minutes 40 seconds.  CDE 5.04   SURGEON:  Benay Pillow, MD, MPH  ANESTHESIOLOGIST: Anesthesiologist: Heniser, Fredric Dine, MD CRNA: Silvana Newness, CRNA   ANESTHESIA:  Topical with tetracaine drops augmented with 1% preservative-free intracameral lidocaine.  ESTIMATED BLOOD LOSS: less than 1 mL.   COMPLICATIONS:  None.   DESCRIPTION OF PROCEDURE:  The patient was identified in the holding room and transported to the operating room and placed in the supine position under the operating microscope.  The right eye was identified as the operative eye and it was prepped and draped in the usual sterile ophthalmic fashion.   A 1.0 millimeter clear-corneal paracentesis was made at the 10:30 position. 0.5 ml of preservative-free 1% lidocaine with epinephrine was injected into the anterior chamber.  The anterior chamber was filled with Healon 5 viscoelastic.  A 2.4 millimeter keratome was used to make a near-clear corneal incision at the 8:00 position.  A curvilinear capsulorrhexis was made with a cystotome and capsulorrhexis forceps.  Balanced salt solution was used to hydrodissect and hydrodelineate the nucleus.   Phacoemulsification was then used in stop and chop fashion to remove the lens nucleus and epinucleus.  The remaining  cortex was then removed using the irrigation and aspiration handpiece. Healon was then placed into the capsular bag to distend it for lens placement.  A lens was then injected into the capsular bag.  The remaining viscoelastic was aspirated.   Wounds were hydrated with balanced salt solution.  The anterior chamber was inflated to a physiologic pressure with balanced salt solution.   Intracameral vigamox 0.1 mL undiluted was injected into the eye and a drop placed onto the ocular surface.  No wound leaks were noted.  The patient was taken to the recovery room in stable condition without complications of anesthesia or surgery  Benay Pillow 06/13/2020, 9:15 AM

## 2020-06-14 ENCOUNTER — Encounter: Payer: Self-pay | Admitting: Ophthalmology

## 2020-06-30 ENCOUNTER — Encounter: Payer: Self-pay | Admitting: Ophthalmology

## 2020-07-05 ENCOUNTER — Encounter: Payer: Self-pay | Admitting: Ophthalmology

## 2020-07-05 ENCOUNTER — Other Ambulatory Visit: Payer: Self-pay

## 2020-07-07 ENCOUNTER — Other Ambulatory Visit
Admission: RE | Admit: 2020-07-07 | Discharge: 2020-07-07 | Disposition: A | Discharge status: Home / Self Care | Payer: Medicare PPO | Source: Ambulatory Visit | Attending: Ophthalmology | Admitting: Ophthalmology

## 2020-07-07 ENCOUNTER — Other Ambulatory Visit: Payer: Self-pay

## 2020-07-07 DIAGNOSIS — Z20822 Contact with and (suspected) exposure to covid-19: Secondary | ICD-10-CM | POA: Diagnosis not present

## 2020-07-07 DIAGNOSIS — Z01818 Encounter for other preprocedural examination: Secondary | ICD-10-CM | POA: Diagnosis present

## 2020-07-07 NOTE — Discharge Instructions (Signed)

## 2020-07-08 LAB — SARS CORONAVIRUS 2 (TAT 6-24 HRS): SARS Coronavirus 2: NEGATIVE

## 2020-07-11 ENCOUNTER — Ambulatory Visit: Payer: Medicare PPO | Admitting: Anesthesiology

## 2020-07-11 ENCOUNTER — Other Ambulatory Visit: Payer: Self-pay

## 2020-07-11 ENCOUNTER — Encounter
Admission: RE | Disposition: A | Discharge status: Home / Self Care | Payer: Self-pay | Source: Home / Self Care | Attending: Ophthalmology

## 2020-07-11 ENCOUNTER — Ambulatory Visit
Admission: RE | Admit: 2020-07-11 | Discharge: 2020-07-11 | Disposition: A | Discharge status: Home / Self Care | Payer: Medicare PPO | Attending: Ophthalmology | Admitting: Ophthalmology

## 2020-07-11 ENCOUNTER — Encounter: Payer: Self-pay | Admitting: Ophthalmology

## 2020-07-11 DIAGNOSIS — Z79899 Other long term (current) drug therapy: Secondary | ICD-10-CM | POA: Insufficient documentation

## 2020-07-11 DIAGNOSIS — Z888 Allergy status to other drugs, medicaments and biological substances status: Secondary | ICD-10-CM | POA: Diagnosis not present

## 2020-07-11 DIAGNOSIS — Z9841 Cataract extraction status, right eye: Secondary | ICD-10-CM | POA: Insufficient documentation

## 2020-07-11 DIAGNOSIS — Z8673 Personal history of transient ischemic attack (TIA), and cerebral infarction without residual deficits: Secondary | ICD-10-CM | POA: Diagnosis not present

## 2020-07-11 DIAGNOSIS — H2512 Age-related nuclear cataract, left eye: Secondary | ICD-10-CM | POA: Insufficient documentation

## 2020-07-11 DIAGNOSIS — Z87891 Personal history of nicotine dependence: Secondary | ICD-10-CM | POA: Insufficient documentation

## 2020-07-11 DIAGNOSIS — Z8551 Personal history of malignant neoplasm of bladder: Secondary | ICD-10-CM | POA: Diagnosis not present

## 2020-07-11 DIAGNOSIS — I10 Essential (primary) hypertension: Secondary | ICD-10-CM | POA: Diagnosis not present

## 2020-07-11 HISTORY — DX: Diverticulosis of intestine, part unspecified, without perforation or abscess without bleeding: K57.90

## 2020-07-11 HISTORY — PX: CATARACT EXTRACTION W/PHACO: SHX586

## 2020-07-11 HISTORY — DX: Gastro-esophageal reflux disease without esophagitis: K21.9

## 2020-07-11 HISTORY — DX: Malignant (primary) neoplasm, unspecified: C80.1

## 2020-07-11 HISTORY — DX: Unspecified hearing loss, unspecified ear: H91.90

## 2020-07-11 SURGERY — PHACOEMULSIFICATION, CATARACT, WITH IOL INSERTION
Anesthesia: Monitor Anesthesia Care | Site: Eye | Laterality: Left

## 2020-07-11 MED ORDER — ARMC OPHTHALMIC DILATING DROPS
1.0000 "application " | OPHTHALMIC | Status: DC | PRN
  Administered 2020-07-11 (×3): 1 via OPHTHALMIC

## 2020-07-11 MED ORDER — FENTANYL CITRATE (PF) 100 MCG/2ML IJ SOLN
INTRAMUSCULAR | Status: DC | PRN
Start: 2020-07-11 — End: 2020-07-11
  Administered 2020-07-11: 50 ug via INTRAVENOUS

## 2020-07-11 MED ORDER — MIDAZOLAM HCL 2 MG/2ML IJ SOLN
INTRAMUSCULAR | Status: DC | PRN
  Administered 2020-07-11: 2 mg via INTRAVENOUS

## 2020-07-11 MED ORDER — LIDOCAINE HCL (PF) 2 % IJ SOLN
INTRAOCULAR | Status: DC | PRN
  Administered 2020-07-11: 1 mL via INTRAOCULAR

## 2020-07-11 MED ORDER — MOXIFLOXACIN HCL 0.5 % OP SOLN
OPHTHALMIC | Status: DC | PRN
  Administered 2020-07-11: 0.2 mL via OPHTHALMIC

## 2020-07-11 MED ORDER — SODIUM HYALURONATE 10 MG/ML IO SOLN
INTRAOCULAR | Status: DC | PRN
  Administered 2020-07-11: 0.55 mL via INTRAOCULAR

## 2020-07-11 MED ORDER — LACTATED RINGERS IV SOLN: INTRAVENOUS | Status: DC

## 2020-07-11 MED ORDER — EPINEPHRINE PF 1 MG/ML IJ SOLN
INTRAOCULAR | Status: DC | PRN
  Administered 2020-07-11: 76 mL via OPHTHALMIC

## 2020-07-11 MED ORDER — SODIUM HYALURONATE 23 MG/ML IO SOLN
INTRAOCULAR | Status: DC | PRN
  Administered 2020-07-11: 0.6 mL via INTRAOCULAR

## 2020-07-11 MED ORDER — TETRACAINE HCL 0.5 % OP SOLN
1.0000 [drp] | OPHTHALMIC | Status: DC | PRN
  Administered 2020-07-11 (×3): 1 [drp] via OPHTHALMIC

## 2020-07-11 SURGICAL SUPPLY — 18 items
CANNULA ANT/CHMB 27GA (MISCELLANEOUS) ×6 IMPLANT
DISSECTOR HYDRO NUCLEUS 50X22 (MISCELLANEOUS) ×3 IMPLANT
GLOVE SURG LX 7.5 STRW (GLOVE) ×4
GLOVE SURG LX STRL 7.5 STRW (GLOVE) ×2 IMPLANT
GLOVE SURG SYN 8.5  E (GLOVE) ×2
GLOVE SURG SYN 8.5 E (GLOVE) ×1 IMPLANT
GOWN STRL REUS W/ TWL LRG LVL3 (GOWN DISPOSABLE) ×2 IMPLANT
GOWN STRL REUS W/TWL LRG LVL3 (GOWN DISPOSABLE) ×6
LENS IOL DIOP 22.0 (Intraocular Lens) ×3 IMPLANT
LENS IOL TECNIS MONO 22.0 (Intraocular Lens) ×1 IMPLANT
MARKER SKIN DUAL TIP RULER LAB (MISCELLANEOUS) ×3 IMPLANT
PACK DR. KING ARMS (PACKS) ×3 IMPLANT
PACK EYE AFTER SURG (MISCELLANEOUS) ×3 IMPLANT
PACK OPTHALMIC (MISCELLANEOUS) ×3 IMPLANT
SYR 3ML LL SCALE MARK (SYRINGE) ×3 IMPLANT
SYR TB 1ML LUER SLIP (SYRINGE) ×3 IMPLANT
WATER STERILE IRR 250ML POUR (IV SOLUTION) ×3 IMPLANT
WIPE NON LINTING 3.25X3.25 (MISCELLANEOUS) ×3 IMPLANT

## 2020-07-11 NOTE — Transfer of Care (Signed)
Immediate Anesthesia Transfer of Care Note  Patient: Daniel Bush  Procedure(s) Performed: CATARACT EXTRACTION PHACO AND INTRAOCULAR LENS PLACEMENT (IOC) LEFT 6.10  00:45.8 (Left Eye)  Patient Location: PACU  Anesthesia Type: MAC  Level of Consciousness: awake, alert  and patient cooperative  Airway and Oxygen Therapy: Patient Spontanous Breathing and Patient connected to supplemental oxygen  Post-op Assessment: Post-op Vital signs reviewed, Patient's Cardiovascular Status Stable, Respiratory Function Stable, Patent Airway and No signs of Nausea or vomiting  Post-op Vital Signs: Reviewed and stable  Complications: No complications documented.

## 2020-07-11 NOTE — Anesthesia Preprocedure Evaluation (Signed)
Anesthesia Evaluation  Patient identified by MRN, date of birth, ID band Patient awake    Reviewed: Allergy & Precautions, H&P , NPO status , Patient's Chart, lab work & pertinent test results  History of Anesthesia Complications Negative for: history of anesthetic complications  Airway Mallampati: II  TM Distance: >3 FB Neck ROM: full    Dental no notable dental hx.    Pulmonary former smoker,    Pulmonary exam normal        Cardiovascular hypertension, On Medications Normal cardiovascular exam Rhythm:regular Rate:Normal     Neuro/Psych  Neuromuscular disease CVA negative psych ROS   GI/Hepatic Neg liver ROS, Medicated,  Endo/Other  negative endocrine ROS  Renal/GU   negative genitourinary   Musculoskeletal   Abdominal   Peds  Hematology negative hematology ROS (+)   Anesthesia Other Findings   Reproductive/Obstetrics                             Anesthesia Physical Anesthesia Plan  ASA: II  Anesthesia Plan: MAC   Post-op Pain Management:    Induction:   PONV Risk Score and Plan:   Airway Management Planned:   Additional Equipment:   Intra-op Plan:   Post-operative Plan:   Informed Consent: I have reviewed the patients History and Physical, chart, labs and discussed the procedure including the risks, benefits and alternatives for the proposed anesthesia with the patient or authorized representative who has indicated his/her understanding and acceptance.       Plan Discussed with:   Anesthesia Plan Comments:         Anesthesia Quick Evaluation

## 2020-07-11 NOTE — Anesthesia Postprocedure Evaluation (Signed)
Anesthesia Post Note  Patient: Daniel Bush  Procedure(s) Performed: CATARACT EXTRACTION PHACO AND INTRAOCULAR LENS PLACEMENT (IOC) LEFT 6.10  00:45.8 (Left Eye)     Patient location during evaluation: PACU Anesthesia Type: MAC Level of consciousness: awake and alert Pain management: pain level controlled Vital Signs Assessment: post-procedure vital signs reviewed and stable Respiratory status: spontaneous breathing Cardiovascular status: stable Postop Assessment: no headache Anesthetic complications: no   No complications documented.  Gillian Scarce

## 2020-07-11 NOTE — Anesthesia Procedure Notes (Signed)
Procedure Name: MAC Date/Time: 07/11/2020 11:05 AM Performed by: Jeannene Patella, CRNA Pre-anesthesia Checklist: Patient identified, Emergency Drugs available, Suction available, Timeout performed and Patient being monitored Patient Re-evaluated:Patient Re-evaluated prior to induction Oxygen Delivery Method: Nasal cannula Placement Confirmation: positive ETCO2

## 2020-07-11 NOTE — H&P (Signed)

## 2020-07-11 NOTE — Op Note (Signed)
OPERATIVE NOTE  Daniel Bush [id number] 07/11/2020   PREOPERATIVE DIAGNOSIS:  Nuclear sclerotic cataract left eye.  H25.12   POSTOPERATIVE DIAGNOSIS:    Nuclear sclerotic cataract left eye.     PROCEDURE:  Phacoemusification with posterior chamber intraocular lens placement of the left eye   LENS:   Implant Name Type Inv. Item Serial No. Manufacturer Lot No. LRB No. Used Action  LENS IOL DIOP 22.0 - [id number] Intraocular Lens LENS IOL DIOP 22.0 [id number] [id number]   Left 1 Implanted      Procedure(s): CATARACT EXTRACTION PHACO AND INTRAOCULAR LENS PLACEMENT (IOC) LEFT 6.10  00:45.8 (Left)  DCB00 +22.0   ULTRASOUND TIME: 0 minutes 45 seconds.  CDE 6.10   SURGEON:  Benay Pillow, MD, MPH   ANESTHESIA:  Topical with tetracaine drops augmented with 1% preservative-free intracameral lidocaine.  ESTIMATED BLOOD LOSS: <1 mL   COMPLICATIONS:  None.   DESCRIPTION OF PROCEDURE:  The patient was identified in the holding room and transported to the operating room and placed in the supine position under the operating microscope.  The left eye was identified as the operative eye and it was prepped and draped in the usual sterile ophthalmic fashion.   A 1.0 millimeter clear-corneal paracentesis was made at the 5:00 position. 0.5 ml of preservative-free 1% lidocaine with epinephrine was injected into the anterior chamber.  The anterior chamber was filled with Healon 5 viscoelastic.  A 2.4 millimeter keratome was used to make a near-clear corneal incision at the 2:00 position.  A curvilinear capsulorrhexis was made with a cystotome and capsulorrhexis forceps.  Balanced salt solution was used to hydrodissect and hydrodelineate the nucleus.   Phacoemulsification was then used in stop and chop fashion to remove the lens nucleus and epinucleus.  The remaining cortex was then removed using the irrigation and aspiration handpiece. Healon was then placed into the capsular bag to distend it for lens  placement.  A lens was then injected into the capsular bag.  The remaining viscoelastic was aspirated.   Wounds were hydrated with balanced salt solution.  The anterior chamber was inflated to a physiologic pressure with balanced salt solution.  Intracameral vigamox 0.1 mL undiltued was injected into the eye and a drop placed onto the ocular surface.  No wound leaks were noted.  The patient was taken to the recovery room in stable condition without complications of anesthesia or surgery  Benay Pillow 07/11/2020, 11:24 AM

## 2020-07-12 ENCOUNTER — Encounter: Payer: Self-pay | Admitting: Ophthalmology

## 2020-09-12 ENCOUNTER — Ambulatory Visit: Payer: Medicare PPO | Attending: Psychiatry | Admitting: Physical Therapy

## 2020-09-12 ENCOUNTER — Other Ambulatory Visit: Payer: Self-pay

## 2020-09-12 ENCOUNTER — Encounter: Payer: Self-pay | Admitting: Physical Therapy

## 2020-09-12 DIAGNOSIS — M6281 Muscle weakness (generalized): Secondary | ICD-10-CM | POA: Insufficient documentation

## 2020-09-12 DIAGNOSIS — R2689 Other abnormalities of gait and mobility: Secondary | ICD-10-CM | POA: Insufficient documentation

## 2020-09-12 NOTE — Therapy (Signed)
[facility] [address]. [place], [place], [address] Phone: [phone]   Fax:  [phone]  Physical Therapy Evaluation  Patient Details  Name: Daniel Bush MRN: [id number] Date of Birth: 1946/05/01 Referring Provider (PT): Mhoon, Larkin Ina, MD   Encounter Date: 09/12/2020   PT End of Session - 09/12/20 1422    Visit Number 1    Number of Visits 4    Date for PT Re-Evaluation 10/10/20    PT Start Time 2458    PT Stop Time 1406    PT Time Calculation (min) 67 min    Equipment Utilized During Treatment Gait belt    Activity Tolerance Patient tolerated treatment well    Behavior During Therapy [facility] for tasks assessed/performed           Past Medical History:  Diagnosis Date  . 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 Cerebral microvascular disease 2014   balance problems  . Diverticulosis   . GERD (gastroesophageal reflux disease)   . H/O blood clots   . History of bladder cancer   . HOH (hard of hearing)    AIDS  . Hypertension   . Kidney stone   . 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 Shingles 2018   face  . Sinus tachycardia   . Stroke [facility])     Past Surgical History:  Procedure Laterality Date  . BACK SURGERY  2018   L4-L5  . BLADDER REMOVAL  2017  . CATARACT EXTRACTION W/PHACO Right 06/13/2020   Procedure: CATARACT EXTRACTION PHACO AND INTRAOCULAR LENS PLACEMENT ([place]) RIGHT;  Surgeon: Eulogio Bear, MD;  Location: [place];  Service: Ophthalmology;  Laterality: Right;  5.04 0:40.2  . CATARACT EXTRACTION W/PHACO Left 07/11/2020   Procedure: CATARACT EXTRACTION PHACO AND INTRAOCULAR LENS PLACEMENT (IOC) LEFT 6.10  00:45.8;  Surgeon: Eulogio Bear, MD;  Location: [place];  Service: Ophthalmology;  Laterality: Left;  . PROSTATECTOMY  07/26/2016   with cystectomy/ due to bladder cancer    There were no vitals  filed for this visit.    Subjective Assessment - 09/12/20 1254    Subjective Pt is a 74 y.o. male referred to PT for abnormal gait and balance. Pt has a PMH of prior lacunar infarct, bladder cx, HTN, sinus Tachycardia, venous insufficiency, spinal stenosis and radiculopathy of lumbar region, and cerebral microvascular disease. Pt believes his balance issues originated in 1987 when exposed to pain thinner (MEK) leading to being out of work for 1 year where he felt 'drunk" messing his balance and speech. Recently pt has noticed in the past year his balance has gotten worse. Pt reports falling forward feeling dizzy with leaning forward. NO injury occurred. Pt reports when he gets tired he drags his left foot which he believes could contribute to his balance problems. Pt lives in a [facility] with 3 STE but no issues with asc/desc stairs. Pt's goal is to improve his balance and safety with walking.    Patient Stated Goals Improve overall endurance and feel safer with mobility.  Decrease falls.    Currently in Pain? No/denies              OBJECTIVE MUSCULOSKELETAL: Tremor: Absent Bulk: Normal Tone: Normal, no clonus  Posture Slight kyphotic posture in sititng and standing  Gait No gross abnormalities in gait noted.  Occasional anterior lean and shuffling of gait as pt walks for proloned period of time (6MWT).  Strength R/L 4/4 Hip flexion  5/5 Hip abduction (seated) 4/4 Hip adduction (seated) 5/5 Knee extension 5/5 Knee flexion 5/5 Ankle Plantarflexion 5/5 Ankle Dorsiflexion   NEUROLOGICAL: Mental Status Patient's fund of knowledge is within normal limits for educational level.  Sensation Grossly intact to light touch bilateral LEs as determined by testing dermatomes L2-S2 respectively Proprioception and hot/cold testing deferred on this date.  VITALS:  BP: 149/102 mm Hg. Reassessed after 2 min rest with sweatshirt taken off. 171/95 mm Hg HR: 81-84 BPM  SPO2: 98%   FUNCTIONAL  OUTCOME MEASURES   Results Comments  6 Minute Walk Test 1,004' Shuffling gait towards end  DGI 21/24                        POSTURAL CONTROL TESTS  Modified Clinical Test of Sensory Interaction for Balance (CTSIB):  CONDITION TIME STRATEGY SWAY  Eyes open, firm surface 30 seconds ankle None  Eyes closed, firm surface 30 seconds ankle None  Eyes open, foam surface 30 seconds ankle AP sway  Eyes closed, foam surface 30 seconds ankle AP sway       Objective measurements completed on examination: See above findings.    PT Education - 09/12/20 1422    Education Details Form/technique with exercise    Person(s) Educated Patient    Methods Explanation;Demonstration;Handout    Comprehension Verbalized understanding               PT Long Term Goals - 09/12/20 1425      PT LONG TERM GOAL #1   Title Pt will improve FOTO score to 67 to display significant improvements in functional mobility.    Baseline 11/15: 49    Time 4    Period Weeks    Status New    Target Date 10/10/20      PT LONG TERM GOAL #2   Title Pt will improve DGI to 24/24 to display clinically significant reduced risk of falls for community and household tasks.    Baseline 11/15: 21/24    Time 4    Period Weeks    Status New    Target Date 10/10/20      PT LONG TERM GOAL #3   Title Pt will improve 6 MWT by 164' with no shuffling of feet towards end of test to display clinically significant improvements in cardiopulmonary endurance with dynamic walking and balance tasks.    Baseline 11/15: 1,004' with shuffling gait pattern    Time 4    Period Weeks    Status New    Target Date 10/10/20      PT LONG TERM GOAL #4   Title Pt will be able to squat down to floor and stand with no UE support to display clinically significant ability to pick items off the floor independently.    Baseline 11/15: Pt can squat to floor but requires single UE support to rise.    Time 4    Period Weeks    Status New     Target Date 10/10/20               Plan - 09/12/20 1442    Clinical Impression Statement Pt is a pleasant 74 y.o. male referred to PT for balance and gait abnormality. Pt has a PMH of venous insufficiency, previous lacunar infarct, bladder cx, HTN, and microvascular disease. Pt  has slight decreased muscular strength in hip flexors scoring 4/5 MMT but generally WNL strength otherwise with full, intact sensation to light touch bilaterally in LE's.  Pt scored a 21/24 on DGI with most difficulty with horizontal/vertical head turns and changes in gait speed. Pt completed 6MWT with a total distance of 1,004' with an aged match norm of 1,729' so pt limited in his walking distance endurance. Towards end of 6MWT pt started to display intermittent shuffling gait of BLE's and L toe catching with ability to correct indep but reports beginning to feel tired. On modified CTSIB pt displayed AP sway on foam with eyes open and increased with eyes closed indicating most difficulty with vestibular component of balance. These impairments have led to 2-3 falls in the last 6 months and has limited pt's ability to work in Systems [employer]. Pt can benefit from further skilled PT treatment to improve functional mobility and reduce risk of future falls by improving LE strength/endurance and high level dynamic balance.    Personal Factors and Comorbidities Age;Comorbidity 3+     Comorbidities HTN, micovascular disease, history of bladder cx, HTN.    Examination-Activity Limitations Squat;Locomotion Level    Examination-Participation Restrictions Community Activity    Stability/Clinical Decision Making Evolving/Moderate complexity    Clinical Decision Making Moderate    Rehab Potential Good    PT Frequency 1x / week    PT Duration 4 weeks    PT Treatment/Interventions ADLs/Self Care Home Management;Gait training;Stair training;Functional mobility training;Therapeutic activities;Therapeutic exercise;Balance  training;Neuromuscular re-education;Patient/family education;Energy conservation    PT Next Visit Plan Reassess HEP and vitals. Assess gait speed.    PT Home Exercise Plan Access Code: [email or web address]    Consulted and Agree with Plan of Care Patient           Patient will benefit from skilled therapeutic intervention in order to improve the following deficits and impairments:  Abnormal gait, Improper body mechanics, Postural dysfunction, Decreased activity tolerance, Decreased endurance, Decreased strength, Decreased balance  Visit Diagnosis: Muscle weakness (generalized)  Other abnormalities of gait and mobility     Problem List Patient Active Problem List   Diagnosis Date Noted  . Lumbar canal stenosis 01/20/2016  . Lumbar radiculopathy 12/13/2015  . 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 Essential (primary) hypertension 08/29/2015   Pura Spice, PT, DPT # [id number] SPT 09/13/2020, 8:45 AM  [facility] [address] [place], [place], [address] Phone: [phone]   Fax:  [phone]  Name: CASHIS RILL MRN: [id number] Date of Birth: Jul 26, 1946

## 2020-09-19 ENCOUNTER — Ambulatory Visit: Payer: Medicare PPO | Admitting: Physical Therapy

## 2020-09-19 ENCOUNTER — Other Ambulatory Visit: Payer: Self-pay

## 2020-09-19 ENCOUNTER — Encounter: Payer: Self-pay | Admitting: Physical Therapy

## 2020-09-19 DIAGNOSIS — M6281 Muscle weakness (generalized): Secondary | ICD-10-CM

## 2020-09-19 DIAGNOSIS — R2689 Other abnormalities of gait and mobility: Secondary | ICD-10-CM

## 2020-09-19 NOTE — Therapy (Signed)
[facility] [address] [place], [place], [address] Phone: [phone]   Fax:  [phone]  Physical Therapy Treatment  Patient Details  Name: Daniel Bush MRN: [id number] Date of Birth: 23-Jan-1946 Referring Provider (PT): Mhoon, Larkin Ina, MD   Encounter Date: 09/19/2020   PT End of Session - 09/19/20 7062    Visit Number 2    Number of Visits 4    Date for PT Re-Evaluation 10/10/20    PT Start Time 3762    PT Stop Time 1435    PT Time Calculation (min) 47 min    Equipment Utilized During Treatment Gait belt    Activity Tolerance Patient tolerated treatment well    Behavior During Therapy 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[place])    Shingles 2018   face   Sinus tachycardia    Stroke [facility])     Past Surgical History:  Procedure Laterality Date   BACK SURGERY  2018   L4-L5   BLADDER REMOVAL  2017   CATARACT EXTRACTION W/PHACO Right 06/13/2020   Procedure: CATARACT EXTRACTION PHACO AND INTRAOCULAR LENS PLACEMENT ([place]) RIGHT;  Surgeon: Eulogio Bear, MD;  Location: [place];  Service: Ophthalmology;  Laterality: Right;  5.04 0:40.2   CATARACT EXTRACTION W/PHACO Left 07/11/2020   Procedure: CATARACT EXTRACTION PHACO AND INTRAOCULAR LENS PLACEMENT ([facility]) LEFT 6.10  00:45.8;  Surgeon: Eulogio Bear, MD;  Location: [place];  Service: Ophthalmology;  Laterality: Left;   PROSTATECTOMY  07/26/2016   with cystectomy/ due to bladder cancer    There were no vitals  filed for this visit.   Subjective Assessment - 09/19/20 1734    Subjective Pt. reports having a few days of decrease balance with difficulty stopping forward momentum.   No falls reported.  BP prior to tx. session: 170/88.  Pt. has chronic h/o low back pain (discussed with PT).    Patient Stated Goals Improve overall endurance and feel safer with mobility.  Decrease falls.    Currently in Pain? No/denies           There.ex.:  Reviewed HEP Nustep L4 10 min. B UE/LE consistent cadence  Neuro:  Walking in hallway with alt. UE and LE touches/ turning/ changes in cadence and head turning Airex step ups/ static standing with EO and EC/ dynamic lunges on Airex Tandem stance/ gait in //-bars (mirror feedback) NBOS and SLS balance ex. In //-bars.     PT Long Term Goals - 09/12/20 1425      PT LONG TERM GOAL #1   Title Pt will improve FOTO score to 67 to display significant improvements in functional mobility.    Baseline 11/15: 49    Time 4    Period Weeks    Status New    Target Date 10/10/20      PT LONG TERM GOAL #2   Title  Pt will improve DGI to 24/24 to display clinically significant reduced risk of falls for community and household tasks.    Baseline 11/15: 21/24    Time 4    Period Weeks    Status New    Target Date 10/10/20      PT LONG TERM GOAL #3   Title Pt will improve 6 MWT by 164' with no shuffling of feet towards end of test to display clinically significant improvements in cardiopulmonary endurance with dynamic walking and balance tasks.    Baseline 11/15: 1,004' with shuffling gait pattern    Time 4    Period Weeks    Status New    Target Date 10/10/20      PT LONG TERM GOAL #4   Title Pt will be able to squat down to floor and stand with no UE support to display clinically significant ability to pick items off the floor independently.    Baseline 11/15: Pt can squat to floor but requires single UE support to rise.    Time 4    Period Weeks    Status  New    Target Date 10/10/20                 Plan - 09/19/20 1753    Clinical Impression Statement PT tx. focused on dynamic balance and proprioceptive tasks in clinic.  Pt. demonstrates consistent gait and ability to turn CW/CCW with no LOB.  Ankle strategy with Airex ex. (static standing with EO) and dynamic lunges with min. to no UE assist in //-bars.  No LOB during tx. session and pt. will continue to focus on HEP.  PT corrected squating technique/ hip and upright posture.  Good LE muscle endurance with use of Nustep and no increase c/o pain or fatigue.    Personal Factors and Comorbidities Age;Comorbidity 3+    Comorbidities HTN, micovascular disease, history of bladder cx, HTN.    Examination-Activity Limitations Squat;Locomotion Level    Examination-Participation Restrictions Community Activity    Stability/Clinical Decision Making Evolving/Moderate complexity    Clinical Decision Making Moderate    Rehab Potential Good    PT Frequency 1x / week    PT Duration 4 weeks    PT Treatment/Interventions ADLs/Self Care Home Management;Gait training;Stair training;Functional mobility training;Therapeutic activities;Therapeutic exercise;Balance training;Neuromuscular re-education;Patient/family education;Energy conservation    PT Next Visit Plan Reassess HEP and vitals. Assess gait speed.    PT Home Exercise Plan Access Code: [email or web address]    Consulted and Agree with Plan of Care Patient           Patient will benefit from skilled therapeutic intervention in order to improve the following deficits and impairments:  Abnormal gait, Improper body mechanics, Postural dysfunction, Decreased activity tolerance, Decreased endurance, Decreased strength, Decreased balance  Visit Diagnosis: Muscle weakness (generalized)  Other abnormalities of gait and mobility     Problem List Patient Active Problem List   Diagnosis Date Noted   Lumbar canal stenosis 01/20/2016   Lumbar radiculopathy  12/13/2015   Artery disease, cerebral 08/29/2015   Essential (primary) hypertension 08/29/2015   Pura Spice, PT, DPT # [phone] 09/19/2020, 6:08 PM  [facility] [address] [place], [place], [address] Phone: [phone]   Fax:  [phone]  Name: SHAY BARTOLI MRN: [id number] Date of Birth: 29-Jan-1946

## 2020-09-26 ENCOUNTER — Other Ambulatory Visit: Payer: Self-pay

## 2020-09-26 ENCOUNTER — Encounter: Payer: Self-pay | Admitting: Physical Therapy

## 2020-09-26 ENCOUNTER — Ambulatory Visit: Payer: Medicare PPO | Admitting: Physical Therapy

## 2020-09-26 DIAGNOSIS — M6281 Muscle weakness (generalized): Secondary | ICD-10-CM

## 2020-09-26 DIAGNOSIS — R2689 Other abnormalities of gait and mobility: Secondary | ICD-10-CM

## 2020-09-27 NOTE — Therapy (Signed)
[facility] [address] [place], [place], [address] Phone: [phone]   Fax:  [phone]  Physical Therapy Treatment  Patient Details  Name: Daniel Bush MRN: [id number] Date of Birth: Nov 04, 1945 Referring Provider (PT): Mhoon, Larkin Ina, MD   Encounter Date: 09/26/2020   PT End of Session - 09/27/20 1242    Visit Number 3    Number of Visits 4    Date for PT Re-Evaluation 10/10/20    PT Start Time 1056    PT Stop Time 1141    PT Time Calculation (min) 45 min    Equipment Utilized During Treatment Gait belt    Activity Tolerance Patient tolerated treatment well    Behavior During Therapy [facility] for tasks assessed/performed           Past Medical History:  Diagnosis Date  . 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 Cerebral microvascular disease 2014   balance problems  . Diverticulosis   . GERD (gastroesophageal reflux disease)   . H/O blood clots   . History of bladder cancer   . HOH (hard of hearing)    AIDS  . Hypertension   . Kidney stone   . 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 Shingles 2018   face  . Sinus tachycardia   . Stroke [facility])     Past Surgical History:  Procedure Laterality Date  . BACK SURGERY  2018   L4-L5  . BLADDER REMOVAL  2017  . CATARACT EXTRACTION W/PHACO Right 06/13/2020   Procedure: CATARACT EXTRACTION PHACO AND INTRAOCULAR LENS PLACEMENT ([place]) RIGHT;  Surgeon: Eulogio Bear, MD;  Location: [place];  Service: Ophthalmology;  Laterality: Right;  5.04 0:40.2  . CATARACT EXTRACTION W/PHACO Left 07/11/2020   Procedure: CATARACT EXTRACTION PHACO AND INTRAOCULAR LENS PLACEMENT ([facility]) LEFT 6.10  00:45.8;  Surgeon: Eulogio Bear, MD;  Location: [place];  Service: Ophthalmology;  Laterality: Left;  . PROSTATECTOMY  07/26/2016   with cystectomy/ due to bladder cancer    There were no vitals  filed for this visit.   Subjective Assessment - 09/26/20 1246    Subjective Pt. states he was sore in low back/ quads after raking leaves for 5 hours this past weekend.  Pt. reports no falls.    Patient Stated Goals Improve overall endurance and feel safer with mobility.  Decrease falls.    Currently in Pain? No/denies           There.ex.:  Nustep L5 10 min. B UE/LE consistent cadence (discussed HEP).   Sit to stands/ Standing hip flexion and abduction ex. In //-bars.    Neuro:  Walking in hallway with alt. UE and LE touches/ turning/ changes in cadence Tandem stance/ gait in //-bars (mirror feedback) NBOS and SLS balance ex. In //-bars. Walking over green hurdles with recip. Gait pattern in //-bars (no UE assist).  Varying height hurdles.      PT Long Term Goals - 09/12/20 1425      PT LONG TERM GOAL #1   Title Pt will improve FOTO score to 67 to display significant improvements in functional mobility.    Baseline 11/15: 49    Time 4    Period Weeks    Status New    Target Date 10/10/20      PT LONG TERM GOAL #2  Title Pt will improve DGI to 24/24 to display clinically significant reduced risk of falls for community and household tasks.    Baseline 11/15: 21/24    Time 4    Period Weeks    Status New    Target Date 10/10/20      PT LONG TERM GOAL #3   Title Pt will improve 6 MWT by 164' with no shuffling of feet towards end of test to display clinically significant improvements in cardiopulmonary endurance with dynamic walking and balance tasks.    Baseline 11/15: 1,004' with shuffling gait pattern    Time 4    Period Weeks    Status New    Target Date 10/10/20      PT LONG TERM GOAL #4   Title Pt will be able to squat down to floor and stand with no UE support to display clinically significant ability to pick items off the floor independently.    Baseline 11/15: Pt can squat to floor but requires single UE support to rise.    Time 4    Period Weeks     Status New    Target Date 10/10/20              Plan - 09/27/20 1537    Clinical Impression Statement Pt. had no loss of balance during higher level dynamic tasks and position changes in hallway with cadence.  Pt. able to stop with no forward momentum and improved LE control.  Moderate B LE muscle fatigue at end of tx. session and MH applied to low back while on Nustep to manage back tightness/ symptoms.    Personal Factors and Comorbidities Age;Comorbidity 3+    Comorbidities HTN, micovascular disease, history of bladder cx, HTN.    Examination-Activity Limitations Squat;Locomotion Level    Examination-Participation Restrictions Community Activity    Stability/Clinical Decision Making Evolving/Moderate complexity    Clinical Decision Making Moderate    Rehab Potential Good    PT Frequency 1x / week    PT Duration 4 weeks    PT Treatment/Interventions ADLs/Self Care Home Management;Gait training;Stair training;Functional mobility training;Therapeutic activities;Therapeutic exercise;Balance training;Neuromuscular re-education;Patient/family education;Energy conservation    PT Next Visit Plan Reassess HEP and vitals. Assess gait speed.    PT Home Exercise Plan Access Code: [email or web address]    Consulted and Agree with Plan of Care Patient           Patient will benefit from skilled therapeutic intervention in order to improve the following deficits and impairments:  Abnormal gait, Improper body mechanics, Postural dysfunction, Decreased activity tolerance, Decreased endurance, Decreased strength, Decreased balance  Visit Diagnosis: Muscle weakness (generalized)  Other abnormalities of gait and mobility     Problem List Patient Active Problem List   Diagnosis Date Noted  . Lumbar canal stenosis 01/20/2016  . Lumbar radiculopathy 12/13/2015  . 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 Essential (primary) hypertension 08/29/2015   Pura Spice, PT, DPT # [phone] 09/27/2020, 3:44  PM  [facility] [address] [place], [place], [address] Phone: [phone]   Fax:  [phone]  Name: Daniel Bush MRN: [id number] Date of Birth: 1946-05-20

## 2020-10-03 ENCOUNTER — Encounter: Payer: Medicare PPO | Admitting: Physical Therapy

## 2020-10-04 ENCOUNTER — Encounter: Payer: Self-pay | Admitting: Physical Therapy

## 2020-10-04 ENCOUNTER — Other Ambulatory Visit: Payer: Self-pay

## 2020-10-04 ENCOUNTER — Ambulatory Visit: Payer: Medicare PPO | Attending: Psychiatry | Admitting: Physical Therapy

## 2020-10-04 VITALS — BP 150/89

## 2020-10-04 DIAGNOSIS — M6281 Muscle weakness (generalized): Secondary | ICD-10-CM | POA: Diagnosis not present

## 2020-10-04 DIAGNOSIS — R2689 Other abnormalities of gait and mobility: Secondary | ICD-10-CM | POA: Diagnosis present

## 2020-10-04 NOTE — Therapy (Signed)
[place] [facility] [address] [place], [place], [address] Phone: [phone]   Fax:  [phone]  Physical Therapy Treatment  Patient Details  Name: Daniel Bush MRN: [id number] Date of Birth: [birth year] Referring Provider (PT): Mhoon, Larkin Ina, MD   Encounter Date: 10/04/2020   PT End of Session - 10/04/20 1125    Visit Number 4    Number of Visits 4    Date for PT Re-Evaluation 10/10/20    PT Start Time 6962    PT Stop Time 1200    PT Time Calculation (min) 55 min    Equipment Utilized During Treatment Gait belt    Activity Tolerance Patient tolerated treatment well    Behavior During Therapy [facility] for tasks assessed/performed           Past Medical History:  Diagnosis Date  . 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 Cerebral microvascular disease 2014   balance problems  . Diverticulosis   . GERD (gastroesophageal reflux disease)   . H/O blood clots   . History of bladder cancer   . HOH (hard of hearing)    AIDS  . Hypertension   . Kidney stone   . 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)   . Shingles 2018   face  . Sinus tachycardia   . Stroke [facility])     Past Surgical History:  Procedure Laterality Date  . BACK SURGERY  2018   L4-L5  . BLADDER REMOVAL  2017  . CATARACT EXTRACTION W/PHACO Right 06/13/2020   Procedure: CATARACT EXTRACTION PHACO AND INTRAOCULAR LENS PLACEMENT ([place]) RIGHT;  Surgeon: Eulogio Bear, MD;  Location: [place];  Service: Ophthalmology;  Laterality: Right;  5.04 0:40.2  . CATARACT EXTRACTION W/PHACO Left 07/11/2020   Procedure: CATARACT EXTRACTION PHACO AND INTRAOCULAR LENS PLACEMENT ([facility]) LEFT 6.10  00:45.8;  Surgeon: Eulogio Bear, MD;  Location: [place];  Service: Ophthalmology;  Laterality: Left;  . PROSTATECTOMY  07/26/2016   with cystectomy/ due to bladder cancer    Vitals:   10/04/20 1129   BP: (!) 150/89     Subjective Assessment - 10/04/20 1124    Subjective Patient notes that he has not had any falls since last session. He does report feeling a bit unwell today; he notes full ears and nose.    Patient Stated Goals Improve overall endurance and feel safer with mobility.  Decrease falls.    Currently in Pain? No/denies           TREATMENT  Neuromuscular Re-education: Gait in hallway, SBA/CGA:  Hip flexion  Change of speed  Head turns (horizontal)  Change of speed with stops  Fast pace with abrupt stopping  Therapeutic Exercise: Nustep L5 10 min, spm 50-60 for cardiovascular health. B UE/LE; during history taking.   Standing LE strengthening:  Hip 3 way, 4# CW, 2x15  Stair taps, 4# CW, 2x15  Hamstring curl, 2x15 STS, BUE support on thighs, 2x10 with 1 LOB posterior (2/2 to leveraging of LE for TKE)  Patient educated throughout session on appropriate technique and form using multi-modal cueing, HEP, and activity modification. Patient articulated understanding and returned demonstration.  Patient Response to interventions: Denies any significant change in energy level or symptoms.  ASSESSMENT Patient presents to clinic with excellent motivation to participate in therapy. Patient demonstrates deficits in balance, gait, and  BLE strength. Patient able to achieve independent stepping strategy balance recovery with fast paced gait trials (SBA/CGA) during today's session and responded positively to active interventions. Patient will benefit from continued skilled therapeutic intervention to address remaining deficits in balance, gait, and BLE strength in order to decrease risk of falls, increase function, and improve overall QOL.     PT Long Term Goals - 09/12/20 1425      PT LONG TERM GOAL #1   Title Pt will improve FOTO score to 67 to display significant improvements in functional mobility.    Baseline 11/15: 49    Time 4    Period Weeks    Status New     Target Date 10/10/20      PT LONG TERM GOAL #2   Title Pt will improve DGI to 24/24 to display clinically significant reduced risk of falls for community and household tasks.    Baseline 11/15: 21/24    Time 4    Period Weeks    Status New    Target Date 10/10/20      PT LONG TERM GOAL #3   Title Pt will improve 6 MWT by 164' with no shuffling of feet towards end of test to display clinically significant improvements in cardiopulmonary endurance with dynamic walking and balance tasks.    Baseline 11/15: 1,004' with shuffling gait pattern    Time 4    Period Weeks    Status New    Target Date 10/10/20      PT LONG TERM GOAL #4   Title Pt will be able to squat down to floor and stand with no UE support to display clinically significant ability to pick items off the floor independently.    Baseline 11/15: Pt can squat to floor but requires single UE support to rise.    Time 4    Period Weeks    Status New    Target Date 10/10/20                 Plan - 10/04/20 1126    Clinical Impression Statement Patient presents to clinic with excellent motivation to participate in therapy. Patient demonstrates deficits in balance, gait, and BLE strength. Patient able to achieve independent stepping strategy balance recovery with fast paced gait trials (SBA/CGA) during today's session and responded positively to active interventions. Patient will benefit from continued skilled therapeutic intervention to address remaining deficits in balance, gait, and BLE strength in order to decrease risk of falls, increase function, and improve overall QOL.    Personal Factors and Comorbidities Age;Comorbidity 3+    Comorbidities HTN, micovascular disease, history of bladder cx, HTN.    Examination-Activity Limitations Squat;Locomotion Level    Examination-Participation Restrictions Community Activity    Stability/Clinical Decision Making Evolving/Moderate complexity    Rehab Potential Good    PT  Frequency 1x / week    PT Duration 4 weeks    PT Treatment/Interventions ADLs/Self Care Home Management;Gait training;Stair training;Functional mobility training;Therapeutic activities;Therapeutic exercise;Balance training;Neuromuscular re-education;Patient/family education;Energy conservation    PT Next Visit Plan RECERT    PT Home Exercise Plan Access Code: [email or web address]    Consulted and Agree with Plan of Care Patient           Patient will benefit from skilled therapeutic intervention in order to improve the following deficits and impairments:  Abnormal gait, Improper body mechanics, Postural dysfunction, Decreased activity tolerance, Decreased endurance, Decreased strength, Decreased balance  Visit Diagnosis: Muscle weakness (generalized)  Other abnormalities  of gait and mobility     Problem List Patient Active Problem List   Diagnosis Date Noted  . Lumbar canal stenosis 01/20/2016  . Lumbar radiculopathy 12/13/2015  . 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 Essential (primary) hypertension 08/29/2015   Myles Gip PT, DPT [phone]  10/04/2020, 1:01 PM  [facility] [address] [place], [place], [address] Phone: [phone]   Fax:  [phone]  Name: Daniel Bush MRN: [id number] Date of Birth: 19-Apr-1946

## 2020-10-10 ENCOUNTER — Ambulatory Visit: Payer: Medicare PPO | Admitting: Physical Therapy

## 2020-10-19 ENCOUNTER — Ambulatory Visit: Payer: Medicare PPO | Admitting: Physical Therapy

## 2020-11-03 ENCOUNTER — Encounter: Payer: Self-pay | Admitting: Physical Therapy

## 2020-11-03 ENCOUNTER — Other Ambulatory Visit: Payer: Self-pay

## 2020-11-03 ENCOUNTER — Ambulatory Visit: Payer: Medicare PPO | Attending: Psychiatry | Admitting: Physical Therapy

## 2020-11-03 DIAGNOSIS — R2689 Other abnormalities of gait and mobility: Secondary | ICD-10-CM | POA: Diagnosis present

## 2020-11-03 DIAGNOSIS — M6281 Muscle weakness (generalized): Secondary | ICD-10-CM | POA: Diagnosis present

## 2020-11-03 NOTE — Therapy (Signed)
[facility] [address] [place], [place], [address] Phone: [phone]   Fax:  [phone]  Physical Therapy Treatment/ DISCHARGE  Patient Details  Name: Daniel Bush MRN: [id number] Date of Birth: [birth year] Referring Provider (PT): Wilhelmina Mcardle, MD   Encounter Date: 11/03/2020   Treatment: 5 of 5.  Recert date: 0/04/6225 3335 to 1149   Past Medical History:  Diagnosis Date  . 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 Cerebral microvascular disease 2014   balance problems  . Diverticulosis   . GERD (gastroesophageal reflux disease)   . H/O blood clots   . History of bladder cancer   . HOH (hard of hearing)    AIDS  . Hypertension   . Kidney stone   . 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 Shingles 2018   face  . Sinus tachycardia   . Stroke [facility])     Past Surgical History:  Procedure Laterality Date  . BACK SURGERY  2018   L4-L5  . BLADDER REMOVAL  2017  . CATARACT EXTRACTION W/PHACO Right 06/13/2020   Procedure: CATARACT EXTRACTION PHACO AND INTRAOCULAR LENS PLACEMENT ([place]) RIGHT;  Surgeon: Eulogio Bear, MD;  Location: [place];  Service: Ophthalmology;  Laterality: Right;  5.04 0:40.2  . CATARACT EXTRACTION W/PHACO Left 07/11/2020   Procedure: CATARACT EXTRACTION PHACO AND INTRAOCULAR LENS PLACEMENT ([facility]) LEFT 6.10  00:45.8;  Surgeon: Eulogio Bear, MD;  Location: [place];  Service: Ophthalmology;  Laterality: Left;  . PROSTATECTOMY  07/26/2016   with cystectomy/ due to bladder cancer    There were no vitals filed for this visit.    Pt. cancelled last several weeks of PT due to Covid. Pt. reports no falls and states he has been doing well. Pt. states he has good and bad days with back pain.         FOTO: 80 (goal met)    TREATMENT  Neuromuscular Re-education:  DGI: 24/24.  Good cadence.  No  LOB.    Walking in hallway with high marching/ hurdles and plinth step overs.   5xSTS: 15.4 sec. (good posture/ no UE assist).    Therapeutic Exercise: Nustep L5 12 min, spm 50-60 for cardiovascular health. B UE/LE; during history taking.    Reviewed HEP/ goal reassessment.  Patient educated throughout session on appropriate technique and form using multi-modal cueing, HEP, and activity modification. Patient articulated understanding and returned demonstration.  Patient Response to interventions: Denies any significant change in energy level or symptoms.     PT Long Term Goals - 11/06/20 1708      PT LONG TERM GOAL #1   Title Pt will improve FOTO score to 67 to display significant improvements in functional mobility.    Baseline 11/15: 49.  1/6: 80    Time 4    Period Weeks    Status Achieved    Target Date 11/03/20      PT LONG TERM GOAL #2   Title Pt will improve DGI to 24/24 to display clinically significant reduced risk of falls for community and household tasks.    Baseline 11/15: 21/24.  1/6: 24/24    Time 4    Period Weeks    Status Achieved    Target Date 11/03/20      PT LONG TERM GOAL #3   Title  Pt will improve 6 MWT by 164' with no shuffling of feet towards end of test to display clinically significant improvements in cardiopulmonary endurance with dynamic walking and balance tasks.    Baseline 11/15: 1,004' with shuffling gait pattern.  1/6:  1214 feet (goal met)    Time 4    Period Weeks    Status Achieved    Target Date 11/03/20      PT LONG TERM GOAL #4   Title Pt will be able to squat down to floor and stand with no UE support to display clinically significant ability to pick items off the floor independently.    Baseline 11/15: Pt can squat to floor but requires single UE support to rise.    Time 4    Period Weeks    Status Partially Met    Target Date 11/03/20             Pt. has not been seen by PT over past month due to Covid. Pt.  states he is doing better and has been trying to stay active with house projects. No recent loss of balance or falls reported. FOTO: 80 (goal met). Discharge from PT at this time with focus on daily walking/ HEP. Pt. instructed to contact PT if any regression in symptoms or questions.      Patient will benefit from skilled therapeutic intervention in order to improve the following deficits and impairments:  Abnormal gait,Improper body mechanics,Postural dysfunction,Decreased activity tolerance,Decreased endurance,Decreased strength,Decreased balance  Visit Diagnosis: Muscle weakness (generalized)  Other abnormalities of gait and mobility     Problem List Patient Active Problem List   Diagnosis Date Noted  . Lumbar canal stenosis 01/20/2016  . Lumbar radiculopathy 12/13/2015  . 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 Essential (primary) hypertension 08/29/2015   Pura Spice, PT, DPT # [phone] 11/06/2020, 5:13 PM  [facility] [address] [place], [place], [address] Phone: [phone]   Fax:  [phone]  Name: Daniel Bush MRN: [id number] Date of Birth: [birth year]

## 2020-11-24 ENCOUNTER — Ambulatory Visit: Payer: Medicare PPO | Admitting: Dermatology

## 2021-01-09 ENCOUNTER — Telehealth (INDEPENDENT_AMBULATORY_CARE_PROVIDER_SITE_OTHER): Payer: Self-pay | Admitting: Gastroenterology

## 2021-01-09 ENCOUNTER — Other Ambulatory Visit: Payer: Self-pay

## 2021-01-09 DIAGNOSIS — Z86010 Personal history of colonic polyps: Secondary | ICD-10-CM

## 2021-01-09 DIAGNOSIS — Z800 Family history of malignant neoplasm of digestive organs: Secondary | ICD-10-CM

## 2021-01-09 MED ORDER — PEG 3350-KCL-NA BICARB-NACL 420 G PO SOLR: 4000.0000 mL | Freq: Once | ORAL | 0 refills | Status: AC

## 2021-01-09 NOTE — Progress Notes (Signed)
Gastroenterology Pre-Procedure Review  Request Date: Monday 01/23/21 Requesting Physician: Dr. Allen Norris  PATIENT REVIEW QUESTIONS: The patient responded to the following health history questions as indicated:    1. Are you having any GI issues? Pt has a urostomy bag. Bladder CA Sept 2017 care received by [facility] 2. Do you have a personal history of Polyps? yes (Pt states he has had colon polyps in the past, and has had them just about every colonoscopy.  last colonoscopy note not noted.) 3. Do you have a family history of Colon Cancer or Polyps? yes (father had colon cancer) 4. Diabetes Mellitus? no 5. Joint replacements in the past 12 months?no 6. Major health problems in the past 3 months?Cataract Surgery July/Aug 2021 7. Any artificial heart valves, MVP, or defibrillator?Pt stated he does have arythmia and takes Carvedilol    MEDICATIONS & ALLERGIES:    Patient reports the following regarding taking any anticoagulation/antiplatelet therapy:   Plavix, Coumadin, Eliquis, Xarelto, Lovenox, Pradaxa, Brilinta, or Effient? no Aspirin? yes (81 mg daily)  Patient confirms/reports the following medications:  Current Outpatient Medications  Medication Sig Dispense Refill  . acetaminophen (TYLENOL) 500 MG tablet Take 500 mg by mouth every 6 (six) hours as needed.    . Ascorbic Acid (VITAMIN C) 100 MG tablet Take 1,000 mg by mouth daily.     Marland Kitchen aspirin EC 81 MG tablet Take by mouth.    . B Complex-Folic Acid (B COMPLEX-VITAMIN B12 PO) Take 1,000 mcg by mouth.     . carvedilol (COREG) 3.125 MG tablet 2 (two) times daily with a meal.     . Cetirizine HCl 10 MG CAPS Take by mouth.    . Cholecalciferol (VITAMIN D3) 10 MCG/ML LIQD Take by mouth.    . Cinnamon 500 MG capsule Take by mouth.    Marland Kitchen guaiFENesin (MUCINEX) 600 MG 12 hr tablet Take by mouth 2 (two) times daily.    . naproxen sodium (ALEVE) 220 MG tablet Take 220 mg by mouth.    . prednisoLONE Sodium Phosphate (PREDNISOL OP) Apply to eye.    .  rosuvastatin (CRESTOR) 5 MG tablet Take by mouth.     No current facility-administered medications for this visit.    Patient confirms/reports the following allergies:  Allergies  Allergen Reactions  . Bee Venom Swelling  . Chlorhexidine Itching and Rash  . Diltiazem Rash  . Lisinopril Rash  . Cefaclor   . Losartan Itching and Rash  . Oxycodone-Acetaminophen Nausea And Vomiting    No orders of the defined types were placed in this encounter.   AUTHORIZATION INFORMATION Primary Insurance: 1D#: Group #:  Secondary Insurance: 1D#: Group #:  SCHEDULE INFORMATION: Date: Monday 01/23/21 Time: Location:[facility]

## 2021-01-12 ENCOUNTER — Encounter: Payer: Self-pay | Admitting: Gastroenterology

## 2021-01-20 ENCOUNTER — Telehealth: Payer: Self-pay

## 2021-01-20 NOTE — Telephone Encounter (Signed)
Cardiac clearance has been granted per [facility]) for patients colonoscopy scheduled 03/03/21 at [facility] with Dr. Allen Norris.  Pryor Montes, RN at [facility] has been notified via secure message.  Thanks,  [place], [place]

## 2021-02-21 ENCOUNTER — Other Ambulatory Visit: Payer: Self-pay

## 2021-02-21 ENCOUNTER — Encounter: Payer: Self-pay | Admitting: Gastroenterology

## 2021-03-02 NOTE — Discharge Instructions (Signed)

## 2021-03-03 ENCOUNTER — Other Ambulatory Visit: Payer: Self-pay

## 2021-03-03 ENCOUNTER — Ambulatory Visit
Admission: RE | Admit: 2021-03-03 | Discharge: 2021-03-03 | Disposition: A | Discharge status: Home / Self Care | Payer: Medicare PPO | Source: Ambulatory Visit | Attending: Gastroenterology | Admitting: Gastroenterology

## 2021-03-03 ENCOUNTER — Ambulatory Visit
Admission: RE | Disposition: A | Discharge status: Home / Self Care | Payer: Self-pay | Source: Ambulatory Visit | Attending: Gastroenterology

## 2021-03-03 ENCOUNTER — Encounter: Payer: Self-pay | Admitting: Gastroenterology

## 2021-03-03 ENCOUNTER — Ambulatory Visit: Payer: Medicare PPO | Admitting: Anesthesiology

## 2021-03-03 DIAGNOSIS — Z885 Allergy status to narcotic agent status: Secondary | ICD-10-CM | POA: Diagnosis not present

## 2021-03-03 DIAGNOSIS — Z888 Allergy status to other drugs, medicaments and biological substances status: Secondary | ICD-10-CM | POA: Insufficient documentation

## 2021-03-03 DIAGNOSIS — D122 Benign neoplasm of ascending colon: Secondary | ICD-10-CM | POA: Diagnosis not present

## 2021-03-03 DIAGNOSIS — Z87891 Personal history of nicotine dependence: Secondary | ICD-10-CM | POA: Insufficient documentation

## 2021-03-03 DIAGNOSIS — Z860100 Personal history of colon polyps, unspecified: Secondary | ICD-10-CM

## 2021-03-03 DIAGNOSIS — Z1211 Encounter for screening for malignant neoplasm of colon: Secondary | ICD-10-CM | POA: Insufficient documentation

## 2021-03-03 DIAGNOSIS — Z8551 Personal history of malignant neoplasm of bladder: Secondary | ICD-10-CM | POA: Diagnosis not present

## 2021-03-03 DIAGNOSIS — Z881 Allergy status to other antibiotic agents status: Secondary | ICD-10-CM | POA: Diagnosis not present

## 2021-03-03 DIAGNOSIS — Z800 Family history of malignant neoplasm of digestive organs: Secondary | ICD-10-CM | POA: Insufficient documentation

## 2021-03-03 DIAGNOSIS — K635 Polyp of colon: Secondary | ICD-10-CM

## 2021-03-03 DIAGNOSIS — K641 Second degree hemorrhoids: Secondary | ICD-10-CM | POA: Diagnosis not present

## 2021-03-03 DIAGNOSIS — Z7982 Long term (current) use of aspirin: Secondary | ICD-10-CM | POA: Diagnosis not present

## 2021-03-03 DIAGNOSIS — Z79899 Other long term (current) drug therapy: Secondary | ICD-10-CM | POA: Insufficient documentation

## 2021-03-03 DIAGNOSIS — K5730 Diverticulosis of large intestine without perforation or abscess without bleeding: Secondary | ICD-10-CM | POA: Diagnosis not present

## 2021-03-03 DIAGNOSIS — Z85828 Personal history of other malignant neoplasm of skin: Secondary | ICD-10-CM | POA: Diagnosis not present

## 2021-03-03 DIAGNOSIS — Z936 Other artificial openings of urinary tract status: Secondary | ICD-10-CM | POA: Insufficient documentation

## 2021-03-03 HISTORY — PX: POLYPECTOMY: SHX5525

## 2021-03-03 HISTORY — PX: COLONOSCOPY WITH PROPOFOL: SHX5780

## 2021-03-03 SURGERY — COLONOSCOPY WITH PROPOFOL
Anesthesia: General

## 2021-03-03 MED ORDER — LACTATED RINGERS IV SOLN: INTRAVENOUS | Status: DC

## 2021-03-03 MED ORDER — LIDOCAINE HCL (CARDIAC) PF 100 MG/5ML IV SOSY
PREFILLED_SYRINGE | INTRAVENOUS | Status: DC | PRN
  Administered 2021-03-03: 20 mg via INTRAVENOUS

## 2021-03-03 MED ORDER — ACETAMINOPHEN 160 MG/5ML PO SOLN: 325.0000 mg | ORAL | Status: DC | PRN

## 2021-03-03 MED ORDER — STERILE WATER FOR IRRIGATION IR SOLN: Status: DC | PRN

## 2021-03-03 MED ORDER — ACETAMINOPHEN 325 MG PO TABS: 325.0000 mg | ORAL_TABLET | ORAL | Status: DC | PRN

## 2021-03-03 MED ORDER — SODIUM CHLORIDE 0.9 % IV SOLN: INTRAVENOUS | Status: DC

## 2021-03-03 MED ORDER — PROPOFOL 10 MG/ML IV BOLUS
INTRAVENOUS | Status: DC | PRN
  Administered 2021-03-03 (×6): 20 mg via INTRAVENOUS
  Administered 2021-03-03: 100 mg via INTRAVENOUS
  Administered 2021-03-03 (×5): 20 mg via INTRAVENOUS

## 2021-03-03 SURGICAL SUPPLY — 24 items
CLIP HMST 235XBRD CATH ROT (MISCELLANEOUS) ×6 IMPLANT
CLIP RESOLUTION 360 11X235 (MISCELLANEOUS) ×9
ELECT REM PT RETURN 9FT ADLT (ELECTROSURGICAL)
ELECTRODE REM PT RTRN 9FT ADLT (ELECTROSURGICAL) IMPLANT
ELEVIEW  INJECTABLE COMP 10 (MISCELLANEOUS) ×1
FORCEPS BIOP RAD 4 LRG CAP 4 (CUTTING FORCEPS) IMPLANT
GOWN CVR UNV OPN BCK APRN NK (MISCELLANEOUS) ×4 IMPLANT
GOWN ISOL THUMB LOOP REG UNIV (MISCELLANEOUS) ×6
INJECTABLE ELEVIEW COMP 10 (MISCELLANEOUS) ×2 IMPLANT
INJECTOR VARIJECT VIN23 (MISCELLANEOUS) ×2 IMPLANT
KIT DEFENDO VALVE AND CONN (KITS) IMPLANT
KIT PRC NS LF DISP ENDO (KITS) ×2 IMPLANT
KIT PROCEDURE OLYMPUS (KITS) ×3
MANIFOLD NEPTUNE II (INSTRUMENTS) ×3 IMPLANT
MARKER SPOT ENDO TATTOO 5ML (MISCELLANEOUS) IMPLANT
PROBE APC STR FIRE (PROBE) IMPLANT
RETRIEVER NET ROTH 2.5X230 LF (MISCELLANEOUS) IMPLANT
SNARE COLD EXACTO (MISCELLANEOUS) IMPLANT
SNARE SHORT THROW 13M SML OVAL (MISCELLANEOUS) ×3 IMPLANT
SNARE SNG USE RND 15MM (INSTRUMENTS) IMPLANT
SPOT EX ENDOSCOPIC TATTOO (MISCELLANEOUS)
TRAP ETRAP POLY (MISCELLANEOUS) ×3 IMPLANT
VARIJECT INJECTOR VIN23 (MISCELLANEOUS) ×3
WATER STERILE IRR 250ML POUR (IV SOLUTION) ×3 IMPLANT

## 2021-03-03 NOTE — Anesthesia Preprocedure Evaluation (Signed)
Anesthesia Evaluation  Patient identified by MRN, date of birth, ID band Patient awake    Reviewed: Allergy & Precautions, H&P , NPO status , Patient's Chart, lab work & pertinent test results, reviewed documented beta blocker date and time   Airway Mallampati: I  TM Distance: >3 FB Neck ROM: full    Dental no notable dental hx.    Pulmonary former smoker,    Pulmonary exam normal breath sounds clear to auscultation       Cardiovascular Exercise Tolerance: Good hypertension, Normal cardiovascular exam Rhythm:regular Rate:Normal     Neuro/Psych CVA negative psych ROS   GI/Hepatic Neg liver ROS, GERD  ,  Endo/Other  negative endocrine ROS  Renal/GU negative Renal ROS  negative genitourinary   Musculoskeletal   Abdominal   Peds  Hematology negative hematology ROS (+)   Anesthesia Other Findings   Reproductive/Obstetrics negative OB ROS                             Anesthesia Physical Anesthesia Plan  ASA: III  Anesthesia Plan: General   Post-op Pain Management:    Induction:   PONV Risk Score and Plan:   Airway Management Planned:   Additional Equipment:   Intra-op Plan:   Post-operative Plan:   Informed Consent: I have reviewed the patients History and Physical, chart, labs and discussed the procedure including the risks, benefits and alternatives for the proposed anesthesia with the patient or authorized representative who has indicated his/her understanding and acceptance.     Dental Advisory Given  Plan Discussed with: CRNA and Anesthesiologist  Anesthesia Plan Comments:         Anesthesia Quick Evaluation

## 2021-03-03 NOTE — H&P (Signed)
Lucilla Lame, MD [facility] [address], [place], [place] [address] [phone] Fax : [phone]  Primary Care Physician:  Sallee Lange, NP Primary Gastroenterologist:  Dr. Allen Norris  Pre-Procedure History & Physical: HPI:  Daniel Bush is a 75 y.o. male is here for an colonoscopy.   Past Medical History:  Diagnosis Date  . Cancer ([place])    SKIN AND BLADDER (2017) with urostomy  . Cerebral microvascular disease 2014   balance problems  . Diverticulosis   . GERD (gastroesophageal reflux disease)   . H/O blood clots   . History of bladder cancer   . HOH (hard of hearing)    AIDS  . Hypertension   . Kidney stone   . 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 Shingles 2018   face  . Sinus tachycardia   . Stroke [facility])     Past Surgical History:  Procedure Laterality Date  . BACK SURGERY  2018   L4-L5  . BLADDER REMOVAL  2017  . CATARACT EXTRACTION W/PHACO Right 06/13/2020   Procedure: CATARACT EXTRACTION PHACO AND INTRAOCULAR LENS PLACEMENT ([place]) RIGHT;  Surgeon: Eulogio Bear, MD;  Location: [place];  Service: Ophthalmology;  Laterality: Right;  5.04 0:40.2  . CATARACT EXTRACTION W/PHACO Left 07/11/2020   Procedure: CATARACT EXTRACTION PHACO AND INTRAOCULAR LENS PLACEMENT ([facility]) LEFT 6.10  00:45.8;  Surgeon: Eulogio Bear, MD;  Location: [place];  Service: Ophthalmology;  Laterality: Left;  . PROSTATECTOMY  07/26/2016   with cystectomy/ due to bladder cancer    Prior to Admission medications   Medication Sig Start Date End Date Taking? Authorizing Provider  acetaminophen (TYLENOL) 500 MG tablet Take 500 mg by mouth every 6 (six) hours as needed.   Yes [provider]  Ascorbic Acid (VITAMIN C) 100 MG tablet Take 1,000 mg by mouth daily.    Yes [provider]  aspirin EC 81 MG tablet Take by mouth.   Yes [provider]  B Complex-Folic Acid (B  COMPLEX-VITAMIN B12 PO) Take 1,000 mcg by mouth.    Yes [provider]  Cetirizine HCl 10 MG CAPS Take by mouth.   Yes [provider]  Cholecalciferol (VITAMIN D3) 10 MCG/ML LIQD Take by mouth.   Yes [provider]  Cinnamon 500 MG capsule Take by mouth.   Yes [provider]  guaiFENesin (MUCINEX) 600 MG 12 hr tablet Take by mouth 2 (two) times daily.   Yes [provider]  guanFACINE (TENEX) 1 MG tablet Take 1 mg by mouth at bedtime.   Yes [provider]  naproxen sodium (ALEVE) 220 MG tablet Take 220 mg by mouth.   Yes [provider]  prednisoLONE Sodium Phosphate (PREDNISOL OP) Apply to eye.   Yes [provider]  rosuvastatin (CRESTOR) 5 MG tablet Take by mouth. 01/06/21 04/06/21 Yes [provider]  ZINC OXIDE PO Take by mouth.   Yes [provider]  carvedilol (COREG) 3.125 MG tablet 2 (two) times daily with a meal.  Patient not taking: Reported on 02/21/2021 06/01/19   [provider]    Allergies as of 01/09/2021 - Review Complete 01/09/2021  Allergen Reaction Noted  . Bee venom Swelling 11/03/2018  . Chlorhexidine Itching and Rash 06/20/2016  . Diltiazem Rash 04/05/2017  . Lisinopril Rash 04/15/2017  . Cefaclor  02/23/2016  . Losartan Itching and Rash  12/24/2017  . Oxycodone-acetaminophen Nausea And Vomiting 11/27/2018    Family History  Problem Relation Age of Onset  . Kidney disease Mother   . Prostate cancer Neg Hx   . Kidney cancer Neg Hx     Social History   Socioeconomic History  . Marital status: Married    Spouse name: Not on file  . Number of children: Not on file  . Years of education: Not on file  . Highest education level: Not on file  Occupational History  . Not on file  Tobacco Use  . Smoking status: Former Smoker    Quit date: 06/02/1986    Years since quitting: 34.7  . Smokeless tobacco: Never Used  Vaping Use  . Vaping Use: Never used  Substance  and Sexual Activity  . Alcohol use: No    Alcohol/week: 0.0 standard drinks  . Drug use: No  . Sexual activity: Not on file  Other Topics Concern  . Not on file  Social History Narrative  . Not on file   Social Determinants of Health   Financial Resource Strain: Not on file  Food Insecurity: Not on file  Transportation Needs: Not on file  Physical Activity: Not on file  Stress: Not on file  Social Connections: Not on file  Intimate Partner Violence: Not on file    Review of Systems: See HPI, otherwise negative ROS  Physical Exam: BP 138/73   Pulse (!) 55   Temp (!) 97.1 F (36.2 C) (Temporal)   Resp 16   Ht 5\' 8"  (1.727 m)   Wt 95.3 kg   SpO2 98%   BMI 31.93 kg/m  General:   Alert,  pleasant and cooperative in NAD Head:  Normocephalic and atraumatic. Neck:  Supple; no masses or thyromegaly. Lungs:  Clear throughout to auscultation.    Heart:  Regular rate and rhythm. Abdomen:  Soft, nontender and nondistended. Normal bowel sounds, without guarding, and without rebound.   Neurologic:  Alert and  oriented x4;  grossly normal neurologically.  Impression/Plan: Daniel Bush is here for an colonoscopy to be performed for history of adenomatous colon polyps with last colonoscopy 6 years ago  Risks, benefits, limitations, and alternatives regarding  colonoscopy have been reviewed with the patient.  Questions have been answered.  All parties agreeable.   Lucilla Lame, MD  03/03/2021, 7:47 AM

## 2021-03-03 NOTE — Op Note (Signed)
[facility] Gastroenterology Patient Name: Daniel Bush Procedure Date: 03/03/2021 8:19 AM MRN: [id number] Account #: [id number] Date of Birth: 1946/10/13 Admit Type: Outpatient Age: 75 Room: [facility] OR ROOM 01 Gender: Male Note Status: Finalized Procedure:             Colonoscopy Indications:           High risk colon cancer surveillance: Personal history                         of colonic polyps Providers:             Lucilla Lame MD, MD Referring MD:          Juluis Rainier (Referring MD) Medicines:             Propofol per Anesthesia Complications:         No immediate complications. Procedure:             Pre-Anesthesia Assessment:                        - Prior to the procedure, a History and Physical was                         performed, and patient medications and allergies were                         reviewed. The patient's tolerance of previous                         anesthesia was also reviewed. The risks and benefits                         of the procedure and the sedation options and risks                         were discussed with the patient. All questions were                         answered, and informed consent was obtained. Prior                         Anticoagulants: The patient has taken no previous                         anticoagulant or antiplatelet agents. ASA Grade                         Assessment: II - A patient with mild systemic disease.                         After reviewing the risks and benefits, the patient                         was deemed in satisfactory condition to undergo the                         procedure.  After obtaining informed consent, the colonoscope was                         passed under direct vision. Throughout the procedure,                         the patient's blood pressure, pulse, and oxygen                         saturations were monitored continuously. The was                          introduced through the anus and advanced to the the                         cecum, identified by appendiceal orifice and ileocecal                         valve. The colonoscopy was performed without                         difficulty. The patient tolerated the procedure well.                         The quality of the bowel preparation was excellent. Findings:      The perianal and digital rectal examinations were normal.      A 15 mm polyp was found in the ascending colon. The polyp was sessile.       Preparations were made for mucosal resection. Chromoscopy with indigo       carmine was done to mark the borders of the lesion. Saline with indigo       carmine was injected to raise the lesion. Forceps and snare mucosal       resection was performed. Resection and retrieval were complete. To close       a defect after polypectomy, three hemostatic clips were successfully       placed (MR conditional). There was no bleeding at the end of the       procedure.      Multiple small-mouthed diverticula were found in the sigmoid colon.      Non-bleeding internal hemorrhoids were found during retroflexion. The       hemorrhoids were Grade II (internal hemorrhoids that prolapse but reduce       spontaneously). Impression:            - One 15 mm polyp in the ascending colon, removed with                         mucosal resection. Resected and retrieved. Clips (MR                         conditional) were placed.                        - Diverticulosis in the sigmoid colon.                        - Non-bleeding internal hemorrhoids.                        -  Mucosal resection was performed. Resection and                         retrieval were complete. Recommendation:        - Discharge patient to home.                        - Resume previous diet.                        - Continue present medications.                        - Await pathology results. Procedure Code(s):     ---  Professional ---                        [phone], Colonoscopy, flexible; with endoscopic mucosal                         resection Diagnosis Code(s):     --- Professional ---                        Z86.010, Personal history of colonic polyps                        K63.5, Polyp of colon CPT copyright 2019 American Medical Association. All rights reserved. The codes documented in this report are preliminary and upon coder review may  be revised to meet current compliance requirements. Lucilla Lame MD, MD 03/03/2021 9:08:02 AM This report has been signed electronically. Number of Addenda: 0 Note Initiated On: 03/03/2021 8:19 AM Scope Withdrawal Time: 0 hours 30 minutes 14 seconds  Total Procedure Duration: 0 hours 33 minutes 18 seconds  Estimated Blood Loss:  Estimated blood loss: none.      [facility]

## 2021-03-03 NOTE — Transfer of Care (Signed)
Immediate Anesthesia Transfer of Care Note  Patient: Daniel Bush  Procedure(s) Performed: COLONOSCOPY WITH PROPOFOL (N/A ) POLYPECTOMY  Patient Location: PACU  Anesthesia Type: General  Level of Consciousness: awake, alert  and patient cooperative  Airway and Oxygen Therapy: Patient Spontanous Breathing   Post-op Assessment: Post-op Vital signs reviewed, Patient's Cardiovascular Status Stable, Respiratory Function Stable, Patent Airway and No signs of Nausea or vomiting  Post-op Vital Signs: Reviewed and stable  Complications: No complications documented.

## 2021-03-03 NOTE — Anesthesia Postprocedure Evaluation (Signed)
Anesthesia Post Note  Patient: Daniel Bush  Procedure(s) Performed: COLONOSCOPY WITH PROPOFOL (N/A ) POLYPECTOMY     Patient location during evaluation: PACU Anesthesia Type: General Level of consciousness: awake and alert Pain management: pain level controlled Vital Signs Assessment: post-procedure vital signs reviewed and stable Respiratory status: spontaneous breathing, nonlabored ventilation, respiratory function stable and patient connected to nasal cannula oxygen Cardiovascular status: blood pressure returned to baseline and stable Postop Assessment: no apparent nausea or vomiting Anesthetic complications: no   No complications documented.  Trecia Rogers

## 2021-03-03 NOTE — Anesthesia Procedure Notes (Signed)
Procedure Name: MAC Date/Time: 03/03/2021 8:28 AM Performed by: Vanetta Shawl, CRNA Pre-anesthesia Checklist: Patient identified, Emergency Drugs available, Suction available, Timeout performed and Patient being monitored Patient Re-evaluated:Patient Re-evaluated prior to induction Oxygen Delivery Method: Nasal cannula Placement Confirmation: positive ETCO2

## 2021-03-06 ENCOUNTER — Encounter: Payer: Self-pay | Admitting: Gastroenterology

## 2021-03-07 LAB — SURGICAL PATHOLOGY

## 2021-04-13 ENCOUNTER — Telehealth: Payer: Self-pay

## 2021-04-13 NOTE — Telephone Encounter (Signed)
-----   Message from Lucilla Lame, MD sent at 03/09/2021 10:27 AM EDT ----- Please have the patient come in to discuss the polyp.  Let him know it was not cancer but it was precancerous and we should discuss what his follow-up should be at his age.

## 2021-04-13 NOTE — Telephone Encounter (Signed)
Contacted pt to schedule follow up to discuss colonoscopy results. Pt stated he gets PET scans yearly so he doesn't need to come in the office. Dr. Allen Norris notified.

## 2022-09-22 ENCOUNTER — Encounter: Payer: Self-pay | Admitting: Emergency Medicine

## 2022-09-22 ENCOUNTER — Ambulatory Visit
Admission: EM | Admit: 2022-09-22 | Discharge: 2022-09-22 | Disposition: A | Discharge status: Home / Self Care | Payer: Medicare PPO | Attending: Emergency Medicine | Admitting: Emergency Medicine

## 2022-09-22 DIAGNOSIS — J069 Acute upper respiratory infection, unspecified: Secondary | ICD-10-CM | POA: Diagnosis not present

## 2022-09-22 MED ORDER — PROMETHAZINE-DM 6.25-15 MG/5ML PO SYRP: 5.0000 mL | ORAL_SOLUTION | Freq: Every evening | ORAL | 0 refills | Status: AC | PRN

## 2022-09-22 MED ORDER — AZITHROMYCIN 250 MG PO TABS: 250.0000 mg | ORAL_TABLET | Freq: Every day | ORAL | 0 refills | Status: AC

## 2022-09-22 MED ORDER — BENZONATATE 100 MG PO CAPS: 100.0000 mg | ORAL_CAPSULE | Freq: Three times a day (TID) | ORAL | 0 refills | Status: AC

## 2022-09-22 NOTE — Discharge Instructions (Signed)
Given azithromycin as directed to provide coverage for bacteria which may be prolonging your symptoms  You may take Tessalon pill every 8 hours to help calm your coughing throughout the day  You may use cough syrup at bedtime, be mindful this medication may make you feel little drowsy  Continue use of Mucinex in addition to these medicines    You can take Tylenol and/or Ibuprofen as needed for fever reduction and pain relief.   For cough: honey 1/2 to 1 teaspoon (you can dilute the honey in water or another fluid).  You can also use guaifenesin and dextromethorphan for cough. You can use a humidifier for chest congestion and cough.  If you don't have a humidifier, you can sit in the bathroom with the hot shower running.      For sore throat: try warm salt water gargles, cepacol lozenges, throat spray, warm tea or water with lemon/honey, popsicles or ice, or OTC cold relief medicine for throat discomfort.   For congestion: take a daily anti-histamine like Zyrtec, Claritin, and a oral decongestant, such as pseudoephedrine.  You can also use Flonase 1-2 sprays in each nostril daily.   It is important to stay hydrated: drink plenty of fluids (water, gatorade/powerade/pedialyte, juices, or teas) to keep your throat moisturized and help further relieve irritation/discomfort.

## 2022-09-22 NOTE — ED Triage Notes (Signed)
Patient c/o dry cough and chest congestion for a week.  Patient reports chills.

## 2022-09-22 NOTE — ED Provider Notes (Signed)
[facility]    CSN: [id number] Arrival date & time: 09/22/22  1022      History   Chief Complaint Chief Complaint  Patient presents with   Cough    HPI Daniel Bush is a 76 y.o. male.   Presents with a productive cough, nasal congestion, rhinorrhea, lower back soreness and wheezing present for 7 days.  Muscle soreness is worsened by coughing.  Wheezing is heard only in the mornings during exhalation, improves throughout the day.  Sputum is described as creamy yellow.  Endorses an associated scratchy throat but denies pain.  Has been able to tolerate food and liquids.  No known sick contacts.  Has attempted use of Mucinex with minimal improvement.     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[facility])    Shingles 2018   face   Sinus tachycardia    Stroke [facility])     Patient Active Problem List   Diagnosis Date Noted   History of colonic polyps    Polyp of ascending colon    History of bladder cancer 12/25/2019   Hip pain, right 08/13/2019   Status post lumbar laminectomy 11/27/2018   B12 deficiency 10/07/2018   Spondylolisthesis at L4-L5 level 09/19/2018   Venous insufficiency 04/05/2017   Sinus tachycardia 03/17/2017   Chronic bilateral low back pain without sciatica 01/24/2017   Chronic right-sided thoracic back pain 01/24/2017   Malignant neoplasm of overlapping sites of bladder ([place]) 06/01/2016   History of BPH 05/09/2016   Lesion of bladder 04/23/2016   Lumbar canal stenosis 01/20/2016   Spinal stenosis of lumbar region 01/20/2016   Lumbar radiculopathy 12/13/2015   Radiculopathy of  lumbar region 12/13/2015   Artery disease, cerebral 08/29/2015   Essential (primary) hypertension 08/29/2015    Past Surgical History:  Procedure Laterality Date   BACK SURGERY  2018   L4-L5   BLADDER REMOVAL  2017   CATARACT EXTRACTION W/PHACO Right 06/13/2020   Procedure: CATARACT EXTRACTION PHACO AND INTRAOCULAR LENS PLACEMENT ([place]) RIGHT;  Surgeon: Eulogio Bear, MD;  Location: [place];  Service: Ophthalmology;  Laterality: Right;  5.04 0:40.2   CATARACT EXTRACTION W/PHACO Left 07/11/2020   Procedure: CATARACT EXTRACTION PHACO AND INTRAOCULAR LENS PLACEMENT ([facility]) LEFT 6.10  00:45.8;  Surgeon: Eulogio Bear, MD;  Location: [place];  Service: Ophthalmology;  Laterality: Left;   COLONOSCOPY WITH PROPOFOL N/A 03/03/2021   Procedure: COLONOSCOPY WITH PROPOFOL;  Surgeon: Lucilla Lame, MD;  Location: [place];  Service: Endoscopy;  Laterality: N/A;   POLYPECTOMY  03/03/2021   Procedure: POLYPECTOMY;  Surgeon: Lucilla Lame, MD;  Location: [place];  Service: Endoscopy;;   PROSTATECTOMY  07/26/2016   with cystectomy/ due to bladder cancer       Home Medications    Prior to Admission medications   Medication Sig Start Date End Date Taking? Authorizing Provider  aspirin EC 81 MG tablet Take by mouth.   Yes [provider]  Cholecalciferol (VITAMIN  D3) 10 MCG/ML LIQD Take by mouth.   Yes [provider]  guanFACINE (TENEX) 1 MG tablet Take 1 mg by mouth at bedtime.   Yes [provider]  rosuvastatin (CRESTOR) 5 MG tablet Take by mouth. 01/06/21 09/22/22 Yes [provider]  acetaminophen (TYLENOL) 500 MG tablet Take 500 mg by mouth every 6 (six) hours as needed.    [provider]  Ascorbic Acid (VITAMIN C) 100 MG tablet Take 1,000 mg by mouth daily.     [provider]  B Complex-Folic Acid (B COMPLEX-VITAMIN B12 PO) Take 1,000 mcg by mouth.     [provider]  carvedilol  (COREG) 3.125 MG tablet 2 (two) times daily with a meal.  Patient not taking: Reported on 02/21/2021 06/01/19   [provider]  Cetirizine HCl 10 MG CAPS Take by mouth.    [provider]  Cinnamon 500 MG capsule Take by mouth.    [provider]  guaiFENesin (MUCINEX) 600 MG 12 hr tablet Take by mouth 2 (two) times daily.    [provider]  naproxen sodium (ALEVE) 220 MG tablet Take 220 mg by mouth.    [provider]  prednisoLONE Sodium Phosphate (PREDNISOL OP) Apply to eye.    [provider]  ZINC OXIDE PO Take by mouth.    [provider]    Family History Family History  Problem Relation Age of Onset   Kidney disease Mother    Prostate cancer Neg Hx    Kidney cancer Neg Hx     Social History Social History   Tobacco Use   Smoking status: Former    Types: Cigarettes    Quit date: 06/02/1986    Years since quitting: 36.3   Smokeless tobacco: Never  Vaping Use   Vaping Use: Never used  Substance Use Topics   Alcohol use: No    Alcohol/week: 0.0 standard drinks of alcohol   Drug use: No     Allergies   Bee venom, Chlorhexidine, Diltiazem, Lisinopril, Cefaclor, Losartan, and Oxycodone-acetaminophen   Review of Systems Review of Systems  Constitutional: Negative.   HENT:  Positive for congestion and rhinorrhea. Negative for dental problem, drooling, ear discharge, ear pain, facial swelling, hearing loss, mouth sores, nosebleeds, postnasal drip, sinus pressure, sinus pain, sneezing, sore throat, tinnitus, trouble swallowing and voice change.   Respiratory:  Positive for cough and wheezing. Negative for apnea, choking, chest tightness, shortness of breath and stridor.   Cardiovascular: Negative.   Gastrointestinal: Negative.   Skin: Negative.   Neurological: Negative.      Physical Exam Triage Vital Signs ED Triage Vitals  Enc Vitals Group     BP 09/22/22 1036 (!) 184/98     Pulse Rate 09/22/22 1036 76      Resp 09/22/22 1036 15     Temp 09/22/22 1036 99.5 F (37.5 C)     Temp Source 09/22/22 1036 Oral     SpO2 09/22/22 1036 98 %     Weight 09/22/22 1033 210 lb 1.6 oz (95.3 kg)     Height 09/22/22 1033 '5\' 8"'$  (1.727 m)     Head Circumference --      Peak Flow --      Pain Score 09/22/22 1033 3     Pain Loc --      Pain Edu? --      Excl. in [place]? --    No data found.  Updated Vital Signs BP (!) 184/98 (  BP Location: Left Arm)   Pulse 76   Temp 99.5 F (37.5 C) (Oral)   Resp 15   Ht '5\' 8"'$  (1.727 m)   Wt 210 lb 1.6 oz (95.3 kg)   SpO2 98%   BMI 31.95 kg/m   Visual Acuity Right Eye Distance:   Left Eye Distance:   Bilateral Distance:    Right Eye Near:   Left Eye Near:    Bilateral Near:     Physical Exam Constitutional:      Appearance: Normal appearance.  HENT:     Head: Normocephalic.     Right Ear: Tympanic membrane, ear canal and external ear normal.     Left Ear: Tympanic membrane, ear canal and external ear normal.     Nose: Congestion and rhinorrhea present.     Mouth/Throat:     Mouth: Mucous membranes are moist.     Pharynx: Oropharynx is clear.  Eyes:     Extraocular Movements: Extraocular movements intact.  Cardiovascular:     Rate and Rhythm: Normal rate and regular rhythm.     Pulses: Normal pulses.     Heart sounds: Normal heart sounds.  Pulmonary:     Effort: Pulmonary effort is normal.     Breath sounds: Normal breath sounds.  Skin:    General: Skin is warm and dry.  Neurological:     Mental Status: He is alert and oriented to person, place, and time. Mental status is at baseline.  Psychiatric:        Mood and Affect: Mood normal.        Behavior: Behavior normal.      UC Treatments / Results  Labs (all labs ordered are listed, but only abnormal results are displayed) Labs Reviewed - No data to display  EKG   Radiology No results found.  Procedures Procedures (including critical care time)  Medications Ordered in  UC Medications - No data to display  Initial Impression / Assessment and Plan / UC Course  I have reviewed the triage vital signs and the nursing notes.  Pertinent labs & imaging results that were available during my care of the patient were reviewed by me and considered in my medical decision making (see chart for details).  Acute upper Respiratory infection  Patient is in no signs of distress nor toxic appearing.  Vital signs are stable.  Low suspicion for pneumonia, pneumothorax or bronchitis and therefore will defer imaging.  Will defer viral testing based on timeline of illness. Prescribed azithromycin, Tessalon and Promethazine DM as cough is most worrisome symptom today. May use additional over-the-counter medications as needed for supportive care.  May follow-up with urgent care as needed if symptoms persist or worsen.    Final Clinical Impressions(s) / UC Diagnoses   Final diagnoses:  None   Discharge Instructions   None    ED Prescriptions   None    PDMP not reviewed this encounter.   Hans Eden, NP 09/22/22 1120

## 2022-10-11 DIAGNOSIS — R053 Chronic cough: Secondary | ICD-10-CM | POA: Diagnosis not present

## 2022-10-11 DIAGNOSIS — J189 Pneumonia, unspecified organism: Secondary | ICD-10-CM | POA: Diagnosis not present

## 2022-10-11 DIAGNOSIS — R0982 Postnasal drip: Secondary | ICD-10-CM | POA: Diagnosis not present

## 2022-10-16 DIAGNOSIS — J189 Pneumonia, unspecified organism: Secondary | ICD-10-CM | POA: Diagnosis not present

## 2022-10-16 DIAGNOSIS — R053 Chronic cough: Secondary | ICD-10-CM | POA: Diagnosis not present

## 2022-11-06 DIAGNOSIS — M4800 Spinal stenosis, site unspecified: Secondary | ICD-10-CM | POA: Diagnosis not present

## 2022-11-19 ENCOUNTER — Other Ambulatory Visit: Payer: Self-pay | Admitting: Physical Medicine & Rehabilitation

## 2022-11-19 DIAGNOSIS — M5442 Lumbago with sciatica, left side: Secondary | ICD-10-CM | POA: Diagnosis not present

## 2022-11-19 DIAGNOSIS — G8929 Other chronic pain: Secondary | ICD-10-CM | POA: Diagnosis not present

## 2022-11-19 DIAGNOSIS — M5441 Lumbago with sciatica, right side: Secondary | ICD-10-CM | POA: Diagnosis not present

## 2022-11-22 DIAGNOSIS — Z8551 Personal history of malignant neoplasm of bladder: Secondary | ICD-10-CM | POA: Diagnosis not present

## 2022-11-22 DIAGNOSIS — Z936 Other artificial openings of urinary tract status: Secondary | ICD-10-CM | POA: Diagnosis not present

## 2022-11-27 ENCOUNTER — Ambulatory Visit
Admission: RE | Admit: 2022-11-27 | Discharge: 2022-11-27 | Disposition: A | Discharge status: Home / Self Care | Payer: Medicare PPO | Source: Ambulatory Visit | Attending: Physical Medicine & Rehabilitation | Admitting: Physical Medicine & Rehabilitation

## 2022-11-27 DIAGNOSIS — G8929 Other chronic pain: Secondary | ICD-10-CM | POA: Insufficient documentation

## 2022-11-27 DIAGNOSIS — M5440 Lumbago with sciatica, unspecified side: Secondary | ICD-10-CM | POA: Diagnosis not present

## 2022-11-27 DIAGNOSIS — M545 Low back pain, unspecified: Secondary | ICD-10-CM | POA: Diagnosis not present

## 2022-11-27 DIAGNOSIS — M4316 Spondylolisthesis, lumbar region: Secondary | ICD-10-CM | POA: Diagnosis not present

## 2022-11-27 MED ORDER — GADOBUTROL 1 MMOL/ML IV SOLN
10.0000 mL | Freq: Once | INTRAVENOUS | Status: AC | PRN
  Administered 2022-11-27: 10 mL via INTRAVENOUS

## 2022-11-29 DIAGNOSIS — D485 Neoplasm of uncertain behavior of skin: Secondary | ICD-10-CM | POA: Diagnosis not present

## 2022-11-29 DIAGNOSIS — Z872 Personal history of diseases of the skin and subcutaneous tissue: Secondary | ICD-10-CM | POA: Diagnosis not present

## 2022-11-29 DIAGNOSIS — L578 Other skin changes due to chronic exposure to nonionizing radiation: Secondary | ICD-10-CM | POA: Diagnosis not present

## 2022-11-29 DIAGNOSIS — L218 Other seborrheic dermatitis: Secondary | ICD-10-CM | POA: Diagnosis not present

## 2022-11-29 DIAGNOSIS — Z859 Personal history of malignant neoplasm, unspecified: Secondary | ICD-10-CM | POA: Diagnosis not present

## 2022-11-29 DIAGNOSIS — L570 Actinic keratosis: Secondary | ICD-10-CM | POA: Diagnosis not present

## 2022-11-29 DIAGNOSIS — Z808 Family history of malignant neoplasm of other organs or systems: Secondary | ICD-10-CM | POA: Diagnosis not present

## 2022-11-29 DIAGNOSIS — D044 Carcinoma in situ of skin of scalp and neck: Secondary | ICD-10-CM | POA: Diagnosis not present

## 2022-11-30 DIAGNOSIS — G8929 Other chronic pain: Secondary | ICD-10-CM | POA: Diagnosis not present

## 2022-11-30 DIAGNOSIS — M5135 Other intervertebral disc degeneration, thoracolumbar region: Secondary | ICD-10-CM | POA: Diagnosis not present

## 2022-11-30 DIAGNOSIS — M48062 Spinal stenosis, lumbar region with neurogenic claudication: Secondary | ICD-10-CM | POA: Diagnosis not present

## 2022-11-30 DIAGNOSIS — M5134 Other intervertebral disc degeneration, thoracic region: Secondary | ICD-10-CM | POA: Diagnosis not present

## 2022-11-30 DIAGNOSIS — M4316 Spondylolisthesis, lumbar region: Secondary | ICD-10-CM | POA: Diagnosis not present

## 2022-11-30 DIAGNOSIS — M5441 Lumbago with sciatica, right side: Secondary | ICD-10-CM | POA: Diagnosis not present

## 2022-11-30 DIAGNOSIS — M5442 Lumbago with sciatica, left side: Secondary | ICD-10-CM | POA: Diagnosis not present

## 2022-11-30 DIAGNOSIS — M47816 Spondylosis without myelopathy or radiculopathy, lumbar region: Secondary | ICD-10-CM | POA: Diagnosis not present

## 2022-12-03 DIAGNOSIS — M48062 Spinal stenosis, lumbar region with neurogenic claudication: Secondary | ICD-10-CM | POA: Diagnosis not present

## 2022-12-11 DIAGNOSIS — H903 Sensorineural hearing loss, bilateral: Secondary | ICD-10-CM | POA: Diagnosis not present

## 2022-12-11 DIAGNOSIS — H6122 Impacted cerumen, left ear: Secondary | ICD-10-CM | POA: Diagnosis not present

## 2022-12-11 DIAGNOSIS — H6062 Unspecified chronic otitis externa, left ear: Secondary | ICD-10-CM | POA: Diagnosis not present

## 2022-12-20 DIAGNOSIS — M48062 Spinal stenosis, lumbar region with neurogenic claudication: Secondary | ICD-10-CM | POA: Diagnosis not present

## 2022-12-20 DIAGNOSIS — M5441 Lumbago with sciatica, right side: Secondary | ICD-10-CM | POA: Diagnosis not present

## 2022-12-20 DIAGNOSIS — M5442 Lumbago with sciatica, left side: Secondary | ICD-10-CM | POA: Diagnosis not present

## 2022-12-20 DIAGNOSIS — G8929 Other chronic pain: Secondary | ICD-10-CM | POA: Diagnosis not present

## 2022-12-26 DIAGNOSIS — C4442 Squamous cell carcinoma of skin of scalp and neck: Secondary | ICD-10-CM | POA: Diagnosis not present

## 2022-12-26 DIAGNOSIS — D044 Carcinoma in situ of skin of scalp and neck: Secondary | ICD-10-CM | POA: Diagnosis not present

## 2022-12-31 DIAGNOSIS — R7302 Impaired glucose tolerance (oral): Secondary | ICD-10-CM | POA: Diagnosis not present

## 2022-12-31 DIAGNOSIS — I10 Essential (primary) hypertension: Secondary | ICD-10-CM | POA: Diagnosis not present

## 2022-12-31 DIAGNOSIS — Z0000 Encounter for general adult medical examination without abnormal findings: Secondary | ICD-10-CM | POA: Diagnosis not present

## 2022-12-31 DIAGNOSIS — Z79899 Other long term (current) drug therapy: Secondary | ICD-10-CM | POA: Diagnosis not present

## 2022-12-31 DIAGNOSIS — Z1331 Encounter for screening for depression: Secondary | ICD-10-CM | POA: Diagnosis not present

## 2022-12-31 DIAGNOSIS — E782 Mixed hyperlipidemia: Secondary | ICD-10-CM | POA: Diagnosis not present

## 2022-12-31 DIAGNOSIS — M5442 Lumbago with sciatica, left side: Secondary | ICD-10-CM | POA: Diagnosis not present

## 2022-12-31 DIAGNOSIS — M546 Pain in thoracic spine: Secondary | ICD-10-CM | POA: Diagnosis not present

## 2022-12-31 DIAGNOSIS — K219 Gastro-esophageal reflux disease without esophagitis: Secondary | ICD-10-CM | POA: Diagnosis not present

## 2022-12-31 DIAGNOSIS — E538 Deficiency of other specified B group vitamins: Secondary | ICD-10-CM | POA: Diagnosis not present

## 2022-12-31 DIAGNOSIS — M4800 Spinal stenosis, site unspecified: Secondary | ICD-10-CM | POA: Diagnosis not present

## 2022-12-31 DIAGNOSIS — Z8551 Personal history of malignant neoplasm of bladder: Secondary | ICD-10-CM | POA: Diagnosis not present

## 2022-12-31 DIAGNOSIS — M48062 Spinal stenosis, lumbar region with neurogenic claudication: Secondary | ICD-10-CM | POA: Diagnosis not present

## 2023-01-10 DIAGNOSIS — L570 Actinic keratosis: Secondary | ICD-10-CM | POA: Diagnosis not present

## 2023-01-10 DIAGNOSIS — L218 Other seborrheic dermatitis: Secondary | ICD-10-CM | POA: Diagnosis not present

## 2023-01-14 DIAGNOSIS — M5442 Lumbago with sciatica, left side: Secondary | ICD-10-CM | POA: Diagnosis not present

## 2023-01-14 DIAGNOSIS — M5441 Lumbago with sciatica, right side: Secondary | ICD-10-CM | POA: Diagnosis not present

## 2023-01-14 DIAGNOSIS — M48062 Spinal stenosis, lumbar region with neurogenic claudication: Secondary | ICD-10-CM | POA: Diagnosis not present

## 2023-01-14 DIAGNOSIS — G8929 Other chronic pain: Secondary | ICD-10-CM | POA: Diagnosis not present

## 2023-02-01 DIAGNOSIS — M5114 Intervertebral disc disorders with radiculopathy, thoracic region: Secondary | ICD-10-CM | POA: Diagnosis not present

## 2023-02-06 DIAGNOSIS — H6993 Unspecified Eustachian tube disorder, bilateral: Secondary | ICD-10-CM | POA: Diagnosis not present

## 2023-02-06 DIAGNOSIS — H65192 Other acute nonsuppurative otitis media, left ear: Secondary | ICD-10-CM | POA: Diagnosis not present

## 2023-02-21 DIAGNOSIS — M48062 Spinal stenosis, lumbar region with neurogenic claudication: Secondary | ICD-10-CM | POA: Diagnosis not present

## 2023-02-21 DIAGNOSIS — G8929 Other chronic pain: Secondary | ICD-10-CM | POA: Diagnosis not present

## 2023-02-21 DIAGNOSIS — M5442 Lumbago with sciatica, left side: Secondary | ICD-10-CM | POA: Diagnosis not present

## 2023-02-21 DIAGNOSIS — M5441 Lumbago with sciatica, right side: Secondary | ICD-10-CM | POA: Diagnosis not present

## 2023-02-22 DIAGNOSIS — H66002 Acute suppurative otitis media without spontaneous rupture of ear drum, left ear: Secondary | ICD-10-CM | POA: Diagnosis not present

## 2023-02-22 DIAGNOSIS — H60332 Swimmer's ear, left ear: Secondary | ICD-10-CM | POA: Diagnosis not present

## 2023-02-26 DIAGNOSIS — R42 Dizziness and giddiness: Secondary | ICD-10-CM | POA: Diagnosis not present

## 2023-02-26 DIAGNOSIS — I10 Essential (primary) hypertension: Secondary | ICD-10-CM | POA: Diagnosis not present

## 2023-03-08 DIAGNOSIS — H60332 Swimmer's ear, left ear: Secondary | ICD-10-CM | POA: Diagnosis not present

## 2023-03-08 DIAGNOSIS — H66002 Acute suppurative otitis media without spontaneous rupture of ear drum, left ear: Secondary | ICD-10-CM | POA: Diagnosis not present

## 2023-03-29 DIAGNOSIS — H26492 Other secondary cataract, left eye: Secondary | ICD-10-CM | POA: Diagnosis not present

## 2023-03-29 DIAGNOSIS — B0230 Zoster ocular disease, unspecified: Secondary | ICD-10-CM | POA: Diagnosis not present

## 2023-03-29 DIAGNOSIS — Z961 Presence of intraocular lens: Secondary | ICD-10-CM | POA: Diagnosis not present

## 2023-04-03 DIAGNOSIS — Z936 Other artificial openings of urinary tract status: Secondary | ICD-10-CM | POA: Diagnosis not present

## 2023-04-03 DIAGNOSIS — Z8551 Personal history of malignant neoplasm of bladder: Secondary | ICD-10-CM | POA: Diagnosis not present

## 2023-04-05 DIAGNOSIS — H6122 Impacted cerumen, left ear: Secondary | ICD-10-CM | POA: Diagnosis not present

## 2023-04-05 DIAGNOSIS — H6982 Other specified disorders of Eustachian tube, left ear: Secondary | ICD-10-CM | POA: Diagnosis not present

## 2023-05-03 DIAGNOSIS — M5414 Radiculopathy, thoracic region: Secondary | ICD-10-CM | POA: Diagnosis not present

## 2023-05-27 DIAGNOSIS — M5442 Lumbago with sciatica, left side: Secondary | ICD-10-CM | POA: Diagnosis not present

## 2023-05-27 DIAGNOSIS — M5414 Radiculopathy, thoracic region: Secondary | ICD-10-CM | POA: Diagnosis not present

## 2023-05-27 DIAGNOSIS — M5441 Lumbago with sciatica, right side: Secondary | ICD-10-CM | POA: Diagnosis not present

## 2023-05-27 DIAGNOSIS — M48062 Spinal stenosis, lumbar region with neurogenic claudication: Secondary | ICD-10-CM | POA: Diagnosis not present

## 2023-05-27 DIAGNOSIS — G8929 Other chronic pain: Secondary | ICD-10-CM | POA: Diagnosis not present

## 2023-05-30 DIAGNOSIS — D485 Neoplasm of uncertain behavior of skin: Secondary | ICD-10-CM | POA: Diagnosis not present

## 2023-05-30 DIAGNOSIS — D2262 Melanocytic nevi of left upper limb, including shoulder: Secondary | ICD-10-CM | POA: Diagnosis not present

## 2023-05-30 DIAGNOSIS — D225 Melanocytic nevi of trunk: Secondary | ICD-10-CM | POA: Diagnosis not present

## 2023-05-30 DIAGNOSIS — L570 Actinic keratosis: Secondary | ICD-10-CM | POA: Diagnosis not present

## 2023-05-30 DIAGNOSIS — C4442 Squamous cell carcinoma of skin of scalp and neck: Secondary | ICD-10-CM | POA: Diagnosis not present

## 2023-05-30 DIAGNOSIS — D2272 Melanocytic nevi of left lower limb, including hip: Secondary | ICD-10-CM | POA: Diagnosis not present

## 2023-05-30 DIAGNOSIS — D2271 Melanocytic nevi of right lower limb, including hip: Secondary | ICD-10-CM | POA: Diagnosis not present

## 2023-05-30 DIAGNOSIS — D2261 Melanocytic nevi of right upper limb, including shoulder: Secondary | ICD-10-CM | POA: Diagnosis not present

## 2023-05-30 DIAGNOSIS — D0439 Carcinoma in situ of skin of other parts of face: Secondary | ICD-10-CM | POA: Diagnosis not present

## 2023-05-30 DIAGNOSIS — X32XXXA Exposure to sunlight, initial encounter: Secondary | ICD-10-CM | POA: Diagnosis not present

## 2023-06-06 DIAGNOSIS — M5414 Radiculopathy, thoracic region: Secondary | ICD-10-CM | POA: Diagnosis not present

## 2023-06-27 DIAGNOSIS — G8929 Other chronic pain: Secondary | ICD-10-CM | POA: Diagnosis not present

## 2023-06-27 DIAGNOSIS — M5414 Radiculopathy, thoracic region: Secondary | ICD-10-CM | POA: Diagnosis not present

## 2023-06-27 DIAGNOSIS — M5442 Lumbago with sciatica, left side: Secondary | ICD-10-CM | POA: Diagnosis not present

## 2023-06-27 DIAGNOSIS — M5441 Lumbago with sciatica, right side: Secondary | ICD-10-CM | POA: Diagnosis not present

## 2023-06-27 DIAGNOSIS — M5416 Radiculopathy, lumbar region: Secondary | ICD-10-CM | POA: Diagnosis not present

## 2023-07-10 DIAGNOSIS — D234 Other benign neoplasm of skin of scalp and neck: Secondary | ICD-10-CM | POA: Diagnosis not present

## 2023-07-10 DIAGNOSIS — C44529 Squamous cell carcinoma of skin of other part of trunk: Secondary | ICD-10-CM | POA: Diagnosis not present

## 2023-07-18 DIAGNOSIS — M5416 Radiculopathy, lumbar region: Secondary | ICD-10-CM | POA: Diagnosis not present

## 2023-07-29 DIAGNOSIS — H903 Sensorineural hearing loss, bilateral: Secondary | ICD-10-CM | POA: Diagnosis not present

## 2023-08-02 DIAGNOSIS — M5414 Radiculopathy, thoracic region: Secondary | ICD-10-CM | POA: Diagnosis not present

## 2023-08-02 DIAGNOSIS — G8929 Other chronic pain: Secondary | ICD-10-CM | POA: Diagnosis not present

## 2023-08-02 DIAGNOSIS — M5442 Lumbago with sciatica, left side: Secondary | ICD-10-CM | POA: Diagnosis not present

## 2023-08-02 DIAGNOSIS — M5441 Lumbago with sciatica, right side: Secondary | ICD-10-CM | POA: Diagnosis not present

## 2023-08-02 DIAGNOSIS — M5416 Radiculopathy, lumbar region: Secondary | ICD-10-CM | POA: Diagnosis not present

## 2023-08-06 DIAGNOSIS — Z936 Other artificial openings of urinary tract status: Secondary | ICD-10-CM | POA: Diagnosis not present

## 2023-08-06 DIAGNOSIS — Z8551 Personal history of malignant neoplasm of bladder: Secondary | ICD-10-CM | POA: Diagnosis not present

## 2023-09-11 DIAGNOSIS — Z8551 Personal history of malignant neoplasm of bladder: Secondary | ICD-10-CM | POA: Diagnosis not present

## 2023-09-11 DIAGNOSIS — Z936 Other artificial openings of urinary tract status: Secondary | ICD-10-CM | POA: Diagnosis not present

## 2023-09-11 DIAGNOSIS — R42 Dizziness and giddiness: Secondary | ICD-10-CM | POA: Diagnosis not present

## 2023-10-07 DIAGNOSIS — Z08 Encounter for follow-up examination after completed treatment for malignant neoplasm: Secondary | ICD-10-CM | POA: Diagnosis not present

## 2023-10-07 DIAGNOSIS — Z8551 Personal history of malignant neoplasm of bladder: Secondary | ICD-10-CM | POA: Diagnosis not present

## 2023-10-07 DIAGNOSIS — R911 Solitary pulmonary nodule: Secondary | ICD-10-CM | POA: Diagnosis not present

## 2023-10-08 DIAGNOSIS — Z9079 Acquired absence of other genital organ(s): Secondary | ICD-10-CM | POA: Diagnosis not present

## 2023-10-08 DIAGNOSIS — Z8551 Personal history of malignant neoplasm of bladder: Secondary | ICD-10-CM | POA: Diagnosis not present

## 2023-10-08 DIAGNOSIS — Z87891 Personal history of nicotine dependence: Secondary | ICD-10-CM | POA: Diagnosis not present

## 2023-10-08 DIAGNOSIS — Z08 Encounter for follow-up examination after completed treatment for malignant neoplasm: Secondary | ICD-10-CM | POA: Diagnosis not present

## 2023-10-08 DIAGNOSIS — D303 Benign neoplasm of bladder: Secondary | ICD-10-CM | POA: Diagnosis not present

## 2023-10-14 DIAGNOSIS — M5414 Radiculopathy, thoracic region: Secondary | ICD-10-CM | POA: Diagnosis not present

## 2023-11-04 DIAGNOSIS — M5442 Lumbago with sciatica, left side: Secondary | ICD-10-CM | POA: Diagnosis not present

## 2023-11-04 DIAGNOSIS — G8929 Other chronic pain: Secondary | ICD-10-CM | POA: Diagnosis not present

## 2023-11-04 DIAGNOSIS — M5414 Radiculopathy, thoracic region: Secondary | ICD-10-CM | POA: Diagnosis not present

## 2023-11-04 DIAGNOSIS — M5441 Lumbago with sciatica, right side: Secondary | ICD-10-CM | POA: Diagnosis not present

## 2023-11-04 DIAGNOSIS — M5416 Radiculopathy, lumbar region: Secondary | ICD-10-CM | POA: Diagnosis not present

## 2023-12-10 DIAGNOSIS — H903 Sensorineural hearing loss, bilateral: Secondary | ICD-10-CM | POA: Diagnosis not present

## 2023-12-10 DIAGNOSIS — H6123 Impacted cerumen, bilateral: Secondary | ICD-10-CM | POA: Diagnosis not present

## 2023-12-16 DIAGNOSIS — Z8551 Personal history of malignant neoplasm of bladder: Secondary | ICD-10-CM | POA: Diagnosis not present

## 2023-12-16 DIAGNOSIS — Z936 Other artificial openings of urinary tract status: Secondary | ICD-10-CM | POA: Diagnosis not present

## 2023-12-27 DIAGNOSIS — Z08 Encounter for follow-up examination after completed treatment for malignant neoplasm: Secondary | ICD-10-CM | POA: Diagnosis not present

## 2023-12-27 DIAGNOSIS — D2271 Melanocytic nevi of right lower limb, including hip: Secondary | ICD-10-CM | POA: Diagnosis not present

## 2023-12-27 DIAGNOSIS — D485 Neoplasm of uncertain behavior of skin: Secondary | ICD-10-CM | POA: Diagnosis not present

## 2023-12-27 DIAGNOSIS — Z85828 Personal history of other malignant neoplasm of skin: Secondary | ICD-10-CM | POA: Diagnosis not present

## 2023-12-27 DIAGNOSIS — C44319 Basal cell carcinoma of skin of other parts of face: Secondary | ICD-10-CM | POA: Diagnosis not present

## 2023-12-27 DIAGNOSIS — D225 Melanocytic nevi of trunk: Secondary | ICD-10-CM | POA: Diagnosis not present

## 2023-12-27 DIAGNOSIS — D2261 Melanocytic nevi of right upper limb, including shoulder: Secondary | ICD-10-CM | POA: Diagnosis not present

## 2023-12-27 DIAGNOSIS — D2272 Melanocytic nevi of left lower limb, including hip: Secondary | ICD-10-CM | POA: Diagnosis not present

## 2023-12-27 DIAGNOSIS — D2262 Melanocytic nevi of left upper limb, including shoulder: Secondary | ICD-10-CM | POA: Diagnosis not present

## 2023-12-27 DIAGNOSIS — L821 Other seborrheic keratosis: Secondary | ICD-10-CM | POA: Diagnosis not present

## 2024-01-01 DIAGNOSIS — R7302 Impaired glucose tolerance (oral): Secondary | ICD-10-CM | POA: Diagnosis not present

## 2024-01-01 DIAGNOSIS — Z0000 Encounter for general adult medical examination without abnormal findings: Secondary | ICD-10-CM | POA: Diagnosis not present

## 2024-01-01 DIAGNOSIS — Z8551 Personal history of malignant neoplasm of bladder: Secondary | ICD-10-CM | POA: Diagnosis not present

## 2024-01-01 DIAGNOSIS — M4800 Spinal stenosis, site unspecified: Secondary | ICD-10-CM | POA: Diagnosis not present

## 2024-01-01 DIAGNOSIS — E785 Hyperlipidemia, unspecified: Secondary | ICD-10-CM | POA: Diagnosis not present

## 2024-01-01 DIAGNOSIS — Z79899 Other long term (current) drug therapy: Secondary | ICD-10-CM | POA: Diagnosis not present

## 2024-01-01 DIAGNOSIS — Z936 Other artificial openings of urinary tract status: Secondary | ICD-10-CM | POA: Diagnosis not present

## 2024-01-01 DIAGNOSIS — I10 Essential (primary) hypertension: Secondary | ICD-10-CM | POA: Diagnosis not present

## 2024-01-01 DIAGNOSIS — M545 Low back pain, unspecified: Secondary | ICD-10-CM | POA: Diagnosis not present

## 2024-02-21 DIAGNOSIS — C44319 Basal cell carcinoma of skin of other parts of face: Secondary | ICD-10-CM | POA: Diagnosis not present

## 2024-02-28 DIAGNOSIS — C44222 Squamous cell carcinoma of skin of right ear and external auricular canal: Secondary | ICD-10-CM | POA: Diagnosis not present

## 2024-02-28 DIAGNOSIS — D485 Neoplasm of uncertain behavior of skin: Secondary | ICD-10-CM | POA: Diagnosis not present

## 2024-03-17 DIAGNOSIS — I10 Essential (primary) hypertension: Secondary | ICD-10-CM | POA: Diagnosis not present

## 2024-03-17 DIAGNOSIS — R42 Dizziness and giddiness: Secondary | ICD-10-CM | POA: Diagnosis not present

## 2024-03-31 DIAGNOSIS — H26492 Other secondary cataract, left eye: Secondary | ICD-10-CM | POA: Diagnosis not present

## 2024-03-31 DIAGNOSIS — Z0100 Encounter for examination of eyes and vision without abnormal findings: Secondary | ICD-10-CM | POA: Diagnosis not present

## 2024-03-31 DIAGNOSIS — H43811 Vitreous degeneration, right eye: Secondary | ICD-10-CM | POA: Diagnosis not present

## 2024-03-31 DIAGNOSIS — H43822 Vitreomacular adhesion, left eye: Secondary | ICD-10-CM | POA: Diagnosis not present

## 2024-03-31 DIAGNOSIS — Z961 Presence of intraocular lens: Secondary | ICD-10-CM | POA: Diagnosis not present

## 2024-04-16 DIAGNOSIS — Z8551 Personal history of malignant neoplasm of bladder: Secondary | ICD-10-CM | POA: Diagnosis not present

## 2024-04-16 DIAGNOSIS — M5416 Radiculopathy, lumbar region: Secondary | ICD-10-CM | POA: Diagnosis not present

## 2024-04-16 DIAGNOSIS — R7303 Prediabetes: Secondary | ICD-10-CM | POA: Diagnosis not present

## 2024-04-16 DIAGNOSIS — Z936 Other artificial openings of urinary tract status: Secondary | ICD-10-CM | POA: Diagnosis not present

## 2024-04-30 DIAGNOSIS — M5414 Radiculopathy, thoracic region: Secondary | ICD-10-CM | POA: Diagnosis not present

## 2024-04-30 DIAGNOSIS — M5441 Lumbago with sciatica, right side: Secondary | ICD-10-CM | POA: Diagnosis not present

## 2024-04-30 DIAGNOSIS — M5416 Radiculopathy, lumbar region: Secondary | ICD-10-CM | POA: Diagnosis not present

## 2024-04-30 DIAGNOSIS — M5442 Lumbago with sciatica, left side: Secondary | ICD-10-CM | POA: Diagnosis not present

## 2024-04-30 DIAGNOSIS — G8929 Other chronic pain: Secondary | ICD-10-CM | POA: Diagnosis not present

## 2024-05-08 DIAGNOSIS — M5414 Radiculopathy, thoracic region: Secondary | ICD-10-CM | POA: Diagnosis not present

## 2024-05-08 DIAGNOSIS — R7303 Prediabetes: Secondary | ICD-10-CM | POA: Diagnosis not present

## 2024-06-01 DIAGNOSIS — M5441 Lumbago with sciatica, right side: Secondary | ICD-10-CM | POA: Diagnosis not present

## 2024-06-01 DIAGNOSIS — M5414 Radiculopathy, thoracic region: Secondary | ICD-10-CM | POA: Diagnosis not present

## 2024-06-01 DIAGNOSIS — M5442 Lumbago with sciatica, left side: Secondary | ICD-10-CM | POA: Diagnosis not present

## 2024-06-01 DIAGNOSIS — M5416 Radiculopathy, lumbar region: Secondary | ICD-10-CM | POA: Diagnosis not present

## 2024-06-01 DIAGNOSIS — G8929 Other chronic pain: Secondary | ICD-10-CM | POA: Diagnosis not present

## 2024-06-18 DIAGNOSIS — Z936 Other artificial openings of urinary tract status: Secondary | ICD-10-CM | POA: Diagnosis not present

## 2024-06-18 DIAGNOSIS — Z8551 Personal history of malignant neoplasm of bladder: Secondary | ICD-10-CM | POA: Diagnosis not present

## 2024-06-25 DIAGNOSIS — Z85828 Personal history of other malignant neoplasm of skin: Secondary | ICD-10-CM | POA: Diagnosis not present

## 2024-06-25 DIAGNOSIS — C44222 Squamous cell carcinoma of skin of right ear and external auricular canal: Secondary | ICD-10-CM | POA: Diagnosis not present

## 2024-08-13 DIAGNOSIS — H903 Sensorineural hearing loss, bilateral: Secondary | ICD-10-CM | POA: Diagnosis not present

## 2024-08-14 DIAGNOSIS — Z936 Other artificial openings of urinary tract status: Secondary | ICD-10-CM | POA: Diagnosis not present

## 2024-08-14 DIAGNOSIS — Z8551 Personal history of malignant neoplasm of bladder: Secondary | ICD-10-CM | POA: Diagnosis not present

## 2024-08-20 NOTE — Progress Notes (Signed)
 Daniel Bush                                          MRN: [id number]   08/20/2024   The [facility] Quality Team Specialist reviewed this patient medical record for the purposes of chart review for care gap closure. The following were reviewed: chart review for care gap closure-controlling blood pressure.    VBCI Quality Team

## 2024-09-04 DIAGNOSIS — I10 Essential (primary) hypertension: Secondary | ICD-10-CM | POA: Diagnosis not present

## 2024-09-04 DIAGNOSIS — Z79899 Other long term (current) drug therapy: Secondary | ICD-10-CM | POA: Diagnosis not present

## 2024-09-04 DIAGNOSIS — R7303 Prediabetes: Secondary | ICD-10-CM | POA: Diagnosis not present

## 2024-09-04 DIAGNOSIS — R001 Bradycardia, unspecified: Secondary | ICD-10-CM | POA: Diagnosis not present

## 2024-09-04 DIAGNOSIS — M5414 Radiculopathy, thoracic region: Secondary | ICD-10-CM | POA: Diagnosis not present

## 2024-09-10 DIAGNOSIS — H903 Sensorineural hearing loss, bilateral: Secondary | ICD-10-CM | POA: Diagnosis not present

## 2024-09-15 DIAGNOSIS — Z79899 Other long term (current) drug therapy: Secondary | ICD-10-CM | POA: Diagnosis not present

## 2024-09-15 DIAGNOSIS — R000 Tachycardia, unspecified: Secondary | ICD-10-CM | POA: Diagnosis not present

## 2024-09-15 DIAGNOSIS — R0989 Other specified symptoms and signs involving the circulatory and respiratory systems: Secondary | ICD-10-CM | POA: Diagnosis not present

## 2024-09-15 DIAGNOSIS — I10 Essential (primary) hypertension: Secondary | ICD-10-CM | POA: Diagnosis not present

## 2024-09-15 DIAGNOSIS — R55 Syncope and collapse: Secondary | ICD-10-CM | POA: Diagnosis not present

## 2024-09-15 DIAGNOSIS — R001 Bradycardia, unspecified: Secondary | ICD-10-CM | POA: Diagnosis not present

## 2024-09-15 DIAGNOSIS — I495 Sick sinus syndrome: Secondary | ICD-10-CM | POA: Diagnosis not present

## 2024-09-21 DIAGNOSIS — M5414 Radiculopathy, thoracic region: Secondary | ICD-10-CM | POA: Diagnosis not present

## 2024-09-21 DIAGNOSIS — M5441 Lumbago with sciatica, right side: Secondary | ICD-10-CM | POA: Diagnosis not present

## 2024-09-21 DIAGNOSIS — M5442 Lumbago with sciatica, left side: Secondary | ICD-10-CM | POA: Diagnosis not present

## 2024-09-21 DIAGNOSIS — M5416 Radiculopathy, lumbar region: Secondary | ICD-10-CM | POA: Diagnosis not present

## 2024-09-21 DIAGNOSIS — G8929 Other chronic pain: Secondary | ICD-10-CM | POA: Diagnosis not present

## 2024-09-22 DIAGNOSIS — H903 Sensorineural hearing loss, bilateral: Secondary | ICD-10-CM | POA: Diagnosis not present

## 2024-10-03 DIAGNOSIS — I495 Sick sinus syndrome: Secondary | ICD-10-CM | POA: Diagnosis not present
